# Patient Record
Sex: Male | Born: 1935 | Race: White | Hispanic: No | Marital: Married | Smoking: Former smoker
Health system: Southern US, Community
[De-identification: ages and names within clinical notes are randomized; demographics above are authoritative.]

## PROBLEM LIST (undated history)

## (undated) DIAGNOSIS — E785 Hyperlipidemia, unspecified: Secondary | ICD-10-CM

## (undated) DIAGNOSIS — Z9889 Other specified postprocedural states: Secondary | ICD-10-CM

## (undated) DIAGNOSIS — Z86711 Personal history of pulmonary embolism: Secondary | ICD-10-CM

## (undated) DIAGNOSIS — I4891 Unspecified atrial fibrillation: Secondary | ICD-10-CM

## (undated) DIAGNOSIS — K269 Duodenal ulcer, unspecified as acute or chronic, without hemorrhage or perforation: Secondary | ICD-10-CM

## (undated) DIAGNOSIS — I10 Essential (primary) hypertension: Secondary | ICD-10-CM

## (undated) DIAGNOSIS — K635 Polyp of colon: Secondary | ICD-10-CM

## (undated) HISTORY — DX: Polyp of colon: K63.5

## (undated) HISTORY — DX: Unspecified atrial fibrillation: I48.91

## (undated) HISTORY — DX: Hyperlipidemia, unspecified: E78.5

## (undated) HISTORY — DX: Duodenal ulcer, unspecified as acute or chronic, without hemorrhage or perforation: K26.9

## (undated) HISTORY — DX: Personal history of pulmonary embolism: Z86.711

## (undated) HISTORY — DX: Essential (primary) hypertension: I10

## (undated) HISTORY — DX: Other specified postprocedural states: Z98.890

---

## 1966-12-18 DIAGNOSIS — K269 Duodenal ulcer, unspecified as acute or chronic, without hemorrhage or perforation: Secondary | ICD-10-CM

## 1966-12-18 HISTORY — DX: Duodenal ulcer, unspecified as acute or chronic, without hemorrhage or perforation: K26.9

## 1983-12-19 DIAGNOSIS — K635 Polyp of colon: Secondary | ICD-10-CM

## 1983-12-19 HISTORY — DX: Polyp of colon: K63.5

## 1988-12-18 DIAGNOSIS — Z9889 Other specified postprocedural states: Secondary | ICD-10-CM

## 1988-12-18 HISTORY — PX: KNEE SURGERY: SHX244

## 1988-12-18 HISTORY — DX: Other specified postprocedural states: Z98.890

## 2000-03-22 ENCOUNTER — Inpatient Hospital Stay (HOSPITAL_COMMUNITY): Admission: EM | Admit: 2000-03-22 | Discharge: 2000-03-26 | Payer: Self-pay | Admitting: Emergency Medicine

## 2000-03-22 ENCOUNTER — Encounter: Payer: Self-pay | Admitting: Internal Medicine

## 2000-03-23 ENCOUNTER — Encounter: Payer: Self-pay | Admitting: Gastroenterology

## 2000-03-25 ENCOUNTER — Encounter: Payer: Self-pay | Admitting: Gastroenterology

## 2000-07-16 ENCOUNTER — Ambulatory Visit (HOSPITAL_COMMUNITY): Admission: RE | Admit: 2000-07-16 | Discharge: 2000-07-16 | Payer: Self-pay | Admitting: Gastroenterology

## 2003-04-01 ENCOUNTER — Encounter: Admission: RE | Admit: 2003-04-01 | Discharge: 2003-04-01 | Payer: Self-pay | Admitting: Otolaryngology

## 2003-04-01 ENCOUNTER — Encounter: Payer: Self-pay | Admitting: Otolaryngology

## 2005-08-08 ENCOUNTER — Encounter (INDEPENDENT_AMBULATORY_CARE_PROVIDER_SITE_OTHER): Payer: Self-pay | Admitting: *Deleted

## 2005-08-08 ENCOUNTER — Ambulatory Visit (HOSPITAL_COMMUNITY): Admission: RE | Admit: 2005-08-08 | Discharge: 2005-08-08 | Payer: Self-pay | Admitting: Gastroenterology

## 2005-12-18 DIAGNOSIS — Z86711 Personal history of pulmonary embolism: Secondary | ICD-10-CM

## 2005-12-18 HISTORY — DX: Personal history of pulmonary embolism: Z86.711

## 2006-06-01 ENCOUNTER — Inpatient Hospital Stay (HOSPITAL_COMMUNITY): Admission: EM | Admit: 2006-06-01 | Discharge: 2006-06-17 | Payer: Self-pay | Admitting: Internal Medicine

## 2006-06-06 ENCOUNTER — Ambulatory Visit: Payer: Self-pay | Admitting: Hematology and Oncology

## 2006-06-07 ENCOUNTER — Encounter: Payer: Self-pay | Admitting: Vascular Surgery

## 2006-09-04 ENCOUNTER — Encounter: Admission: RE | Admit: 2006-09-04 | Discharge: 2006-09-04 | Payer: Self-pay | Admitting: Cardiology

## 2006-12-18 DIAGNOSIS — I4891 Unspecified atrial fibrillation: Secondary | ICD-10-CM

## 2006-12-18 HISTORY — DX: Unspecified atrial fibrillation: I48.91

## 2007-02-12 ENCOUNTER — Encounter: Admission: RE | Admit: 2007-02-12 | Discharge: 2007-02-12 | Payer: Self-pay | Admitting: Gastroenterology

## 2007-03-27 ENCOUNTER — Ambulatory Visit (HOSPITAL_COMMUNITY): Admission: RE | Admit: 2007-03-27 | Discharge: 2007-03-27 | Payer: Self-pay | Admitting: Cardiology

## 2008-05-05 ENCOUNTER — Ambulatory Visit: Payer: Self-pay

## 2010-08-11 ENCOUNTER — Ambulatory Visit (HOSPITAL_COMMUNITY): Admission: RE | Admit: 2010-08-11 | Discharge: 2010-08-11 | Payer: Self-pay | Admitting: Gastroenterology

## 2010-08-11 LAB — HM COLONOSCOPY

## 2011-05-05 NOTE — Op Note (Signed)
NAME:  Nicholas Cruz, Nicholas Cruz               ACCOUNT NO.:  [id number]   MEDICAL RECORD NO.:  [id number]          PATIENT TYPE:  AMB   LOCATION:  ENDO                         FACILITY:  [facility]   PHYSICIAN:  Danise Edge, M.D.   DATE OF BIRTH:  Apr 26, 1931   DATE OF PROCEDURE:  08/08/2005  DATE OF DISCHARGE:                                 OPERATIVE REPORT   PROCEDURE:  Colonoscopy and polypectomy.   INDICATIONS:  Mr. [name] is a 77 year old male born [birth year].  Mr. [name] has undergone colonoscopic exams in the past to remove colon  polyps.  He is due for a surveillance colonoscopy with polypectomy to  prevent colon cancer.   PREMEDICATION:  Versed 10 mg and Demerol 75 mg.   DESCRIPTION OF PROCEDURE:  After obtaining informed consent, Mr. [name] was  placed in the left lateral decubitus position. I administered intravenous  Demerol and intravenous Versed to achieve conscious sedation for the  procedure. The patient's blood pressure, oxygen saturation and cardiac  rhythm were monitored throughout the procedure and documented in the medical  record.   Anal inspection was normal. Digital rectal exam reveals a nonnodular  prostate. The Olympus adjustable pediatric colonoscope was introduced into  the rectum and easily advanced to the cecum.  A normal-appearing appendiceal  orifice and ileocecal valve were identified.  Colonic preparation for the  exam today was excellent   Rectum normal.  Retroflex view of the distal rectum normal.   Sigmoid colon and descending colon:  Left colonic diverticulosis.   Splenic flexure:  A 2-mm sessile polyp was removed with the electrocautery  snare and submitted pathologic interpretation.   Transverse colon normal.   Hepatic flexure normal.   Ascending colon normal.   Cecum and ileocecal valve normal.   ASSESSMENT:  1.  From the splenic flexure, a small polyp was removed with the      electrocautery snare.  2.  Left colonic  diverticulosis.           ______________________________  Danise Edge, M.D.     MJ/MEDQ  D:  08/08/2005  T:  08/08/2005  Job:  [id number]

## 2011-05-05 NOTE — Discharge Summary (Signed)
[place]. [facility]  Patient:    Nicholas Cruz                        MRN: [id number] Adm. Date:  03/22/00 Disc. Date: 03/26/00 Attending:  Verlin Grills, M.D.                           Discharge Summary  DISCHARGE DIAGNOSIS:  Recent onset atrial flutter.  DISCHARGE MEDICATIONS:  Per cardiology recommendations.  HISTORY OF PRESENT ILLNESS AND HOSPITAL COURSE:  Nicholas Cruz. Nicholas Cruz is a 77 year old male admitted to the hospital by Dr. Robley Fries March 22, 2000, with chest pressure-type discomfort and recent onset atrial flutter.  Dr. Corliss Marcus was consulted to manage this cardiac arrhythmia.  According to the medical record, [name] was started on Lovenox and Coumadin.  He was also started on sotalol.  On March 26, 2000, his atrial flutter converted to sinus rhythm.  His adenosine Cardiolite myocardial perfusion study was normal.  His left ventricular ejection fraction was 60%.  [name] was discharged in stable medical condition on Coumadin, to be followed up by Dr. Corliss Marcus.  LABORATORY DATA:  Discharge white blood cell count 10,500, hemoglobin 13.4 g, hematocrit 37.8%.  Complete metabolic panel was normal.  Lipid profile revealed a total cholesterol 234, triglycerides 203, HDL cholesterol 36, LDL cholesterol 157.  Cardiac enzymes were negative for myocardial ischemia.  TSH level 1.8 (normal). DD:  05/17/00 TD:  05/21/00 Job: [id number]

## 2011-05-05 NOTE — Consult Note (Signed)
[place]. [facility]  Patient:    Nicholas Cruz, Nicholas Cruz                      MRN: [id number] Proc. Date: 03/22/00 Adm. Date:  [id number] Attending:  Dennison Bulla Ii CC:         Pearla Dubonnet, M.D.                          Consultation Report  REASON FOR CONSULTATION:  Recent onset of atrial flutter.  HISTORY OF PRESENT ILLNESS:  Nicholas Cruz is a 77 year old man who has a history f intermittently irregular heart beat, which is brief, who this morning awoke in  sweat.  He got up to go to the bathroom and was found to be very light-headed and weak.  This resolved after about a half an hour but he did feel rather poorly so he presented himself to the office where atrial flutter with 2:1 block and ventricular rate of 150 were diagnosed.  Importantly, Nicholas Cruz has a history of chest pains which have been atypical in nature.  This occurred yesterday and has been worse than it was previously, was a mid-to-lower substernal pressure and aching discomfort that was relieved with belching.  This has been going on for several months.  Yesterday, his episode was worse and with Mylanta, it improved.  He did not feel any discomfort this morning in his chest when awoke.  He has not had any today.  His cardiac risk factors are hypertension, family history of coronary artery disease, age, sex and mildly elevated cholesterol.  PAST MEDICAL HISTORY: 1. Peptic ulcer disease/dyspepsia. 2. Irritable bowel syndrome. 3. Colonic polyps. 4. Allergic rhinitis.  MEDICATIONS: 1. Claritin-D 24 one p.o. q.d. 2. Pepcid 20 mg p.o. b.i.d. 3. Atenolol 50 mg p.o. q.h.s. 4. Viagra 100 mg p.o., none recently.  DRUG ALLERGIES:  None known.  PAST SURGICAL HISTORY:  Knee repair.  SOCIAL HISTORY:  Tobacco use from age 69 to 31 and still rare usage.  ETOH is moderate.  He is retired from [employer], married, accompanied by his wife and two daughters  today.  FAMILY HISTORY:  Father died of a myocardial infarction, age 72.  REVIEW OF SYSTEMS:  He admits to some decreasing exertional tolerance lately but no frequent headaches or double vision, no difficulty with his hearing, no dysphagia. No cough, hemoptysis, wheezing, shortness of breath.  No abdominal pain, diarrhea, constipation, hematochezia or melena.  No orthopnea, PND or lower extremity edema. No syncopal episodes.  No hematuria or dysuria, difficulty starting or stopping the stream.  No major joint pains.  No muscle weakness.  No history or seizure or stroke.  PHYSICAL EXAMINATION:  VITAL SIGNS:  Blood pressure 130/80.  Heart rate currently is 72 and regular. Respiratory rate 16.  Temperature 97.1.  GENERAL:  This is a well-appearing 77 year old man in no distress.  HEENT:  Unremarkable.  The head is atraumatic and normocephalic.  Pupils are equal, round and reactive to light and accommodation.  Extraocular movements are intact. Oral mucosa is pink and moist.  The tongue is not coated.  NECK:  Supple without thyromegaly or masses.  The carotid upstrokes are normal.  There is no bruit.  There is no jugular venous distention.  CHEST:  Clear with normal vesicular breath sounds throughout.  The precordium is quiet.  Normal S1 and S2.  No murmur, click or rub noted.  ABDOMEN:  Soft, flat, nontender.  No hepatosplenomegaly or midline pulsatile masses.  GENITALIA:  Normal male phallus.  Descended testes.  No lesions.  RECTAL:  Not performed.  EXTREMITIES:  Full range of motion.  No edema.  Intact distal pulses.  NEUROLOGIC:  Cranial nerves II-XII are intact.  Motor and sensory grossly intact. Gait not tested.  SKIN:  Warm, dry and clear.  LABORATORY AND X-RAY FINDINGS:  His electrocardiogram currently is normal sinus  rhythm, heart rate of 73 beats per minute, no significant changes.  Previously n the office, atrial flutter, 2:1 block, rate of 150, no  clear ischemic changes.  ASSESSMENT: 1. Paroxysm of atrial flutter, etiology uncertain; could be ethanol abuse,    secondary to pseudoephendrine present in Claritin-D, coronary artery    disease-related, structural heart disease.     I am concerned about his episodes of chest discomfort, even though he responded    to Mylanta.  They are temporally related to the onset of his tachyarrhythmia.  2. Hypertension; history of adequate control.  3. Gastroesophageal reflux disease.  4. Remote history of peptic ulcer disease.  PLAN/RECOMMENDATIONS: 1. I agree with your management thus far including the use of proton pump    inhibitor, IV Cardizem, Lovenox and aspirin.  Agree with increase in atenolol. 2. Agree with rule out of myocardial infarction by serial CK-MB enzyme and    troponin level.  Repeat ECG. 3. Will obtain exercise Cardiolite if rule out is negative.  Thank you very much for allowing me to assist in the care of Nicholas Cruz; it has been a pleasure to do so. DD:  03/22/00 TD:  03/22/00 Job: [id number]

## 2011-05-05 NOTE — Consult Note (Signed)
NAME:  Nicholas Cruz, Nicholas Cruz               ACCOUNT NO.:  [id number]   MEDICAL RECORD NO.:  [id number]          PATIENT TYPE:  INP   LOCATION:  3739                         FACILITY:  [facility]   PHYSICIAN:  Lauretta I. Odogwu, M.D.DATE OF BIRTH:  August 21, 1931   DATE OF CONSULTATION:  06/06/2006  DATE OF DISCHARGE:                                   CONSULTATION   CONSULTING PHYSICIAN:  Francisca December, M.D., Cardiology   REASON FOR CONSULTATION:  Pulmonary embolism.   FINDINGS:  The patient is a 77 year old gentleman admitted for evaluation  and management of chest pain.  He presented on June 01, 2006 with sudden  onset chest pain with shortness of breath at rest.  The patient has a  history significant for  paroxysmal atrial fibrillation/flutter and takes  antiarrhythmics.  He has never been treated with Coumadin in the past.  Evaluation noted an elevated D-dimer.  This prompted a chest angiogram  obtained on June 01, 2006 that demonstrated multilobar acute pulmonary  emboli with clot burden highest in the right middle and right lower lobe  pulmonary artery distribution.  The patient himself denies a personal or  family history of blood clots.  Specifically denies history of anorexia,  weight loss, and abdominal pain.  History is also negative for GI or GU  blood loss.   PAST MEDICAL HISTORY:  1.  History of paroxysmal atrial fibrillation/atrial flutter.  2.  Hypertension.  3.  Hyperlipidemia.  4.  GERD.  5.  History of peptic ulcer disease.  6.  History of colon polyps.  7.  History of irritable bowel syndrome.  8.  Status post knee surgery.   ALLERGIES:  No known drug allergies.   MEDICATIONS AT HOME:  Aspirin, Zetia, hydrochlorothiazide, Avapro, Librax,  Protonix, Xanax, heparin, calcium, nitroglycerine.   SOCIAL HISTORY:  The patient is married.  He is a retired [employer] manager.  He was a  previous smoker, smoking two packs of cigarette daily which he discontinued  two years ago.  Denies  history of alcohol use.   FAMILY HISTORY:  Negative for hypercoagulable disorders.   PHYSICAL EXAMINATION:  The patient is a well-appearing man, currently no  distress.  VITAL SIGNS:  Pulse 53, blood pressure 93/52, temperature 97.3, respirations  20, sats 96%.  HEENT:  Head is normocephalic, atraumatic.  Sclera is anicteric.  Mouth  moist.  No ulcerations or lesions.  Neck supple without thyromegaly.  CHEST:  Demonstrates good air entry bilaterally.  Clear to both percussion  and auscultation.  CARDIOVASCULAR EXAM:  Reveals first and second heart sounds, no added sounds  or murmurs.  ABDOMEN:  Soft, nontender with no hepatomegaly and bowel sounds present.  EXTREMITIES:  Demonstrate no calf tenderness.  Pulses were present and  symmetrical.  CNS:  Demonstrates no focal deficits.   LABORATORY DATA:  CBC:  White cell count 8.7, hemoglobin 13, hematocrit 39,  platelets 115,000.  Sodium 141, potassium 3.9, chloride 107, CO2 27, BUN 23,  creatinine 1.3, glucose 86.   IMPRESSION AND PLAN:  Pulmonary embolism:  Etiology is likely cardiac as the  pateint has  does have a history of atrial fibrillation. However a  hypercoaguable state needs to be ruled out.  He will have the following  tests drawn:- lupus anticoagulants, cardiolipin IgG, IgA,IgM antibodies,  Factor V  Leiden PCR, and homosteine level.  Because he is already on  anticaogulation will hold off checking antithrombin III, protein C and S  levels as these can be affected by anticoagulation.  I would also recommend  a serum protein electrophoresis, bilateral lower extremity Doppler's and CT  scan of the abdomen and pelvis to rule out occult malignancy.  Thank you for this consult.  Will follow with you.      Lauretta I. Odogwu, M.D.  Electronically Signed     LIO/MEDQ  D:  06/06/2006  T:  06/06/2006  Job:  [id number]   cc:   Francisca December, M.D.  Fax: [phone]   Danise Edge, M.D.  Fax: [phone]

## 2011-05-05 NOTE — Discharge Summary (Signed)
NAME:  Nicholas, Cruz               ACCOUNT NO.:  [id number]   MEDICAL RECORD NO.:  [id number]          PATIENT TYPE:  INP   LOCATION:  3739                         FACILITY:  [facility]   PHYSICIAN:  Corky Crafts, MDDATE OF BIRTH:  [birth year]   DATE OF ADMISSION:  06/01/2006  DATE OF DISCHARGE:  06/17/2006                                 DISCHARGE SUMMARY   DISCHARGE DIAGNOSES:  1.  Chest pain, resolved.  2.  Pulmonary embolism.  3.  Longterm Coumadin therapy initiated.  4.  Paroxysmal atrial fibrillation.  5.  Dyslipidemia.  6.  Hypertension.  7.  Chronic insomnia.  8.  Gastroesophageal reflux disease.  9.  Seasonal allergies.  10. Irritable bowel syndrome.  11. Longterm medication use.  12. Family history coronary artery disease.   HOSPITAL COURSE:  [name] is a 77 year old male patient who was admitted  on June 01, 2006, with history of chest pain.  Cardiac enzymes were  negative.  However, D-dimer was elevated at 1.47.  Ultimately a CT of the  chest was apparently done and it was positive for pulmonary embolism.  The  patient was given IV heparin and the Coumadin load was initiated.  His INR  slowly increased over a 10-day period and eventually became therapeutic.  Therefore, he was discharged to home.   Dr. Dalene Carrow was consulted from the Hematology/Oncology Service for further  input.  She ordered other lab studies, which include factor V, which was  negative, lupus anticoagulant was not detected.  PTT-LA was 170.9. PTT-LA  confirmation was 0, PTLA 4 to 1 mix elevated at 124.4, DRVVT was 50.8,  homocysteine level 20.9.  Alpha 1 globulin 5.8, beta 2 globulin 7.1.   Other labs traced during the patient's hospitalization included a hemoglobin  of 13.4, hematocrit 40.3, white count 8.6, platelets 200, PT on discharge  23.2, INR 2.0.  Total cholesterol 125, triglycerides 129, LDL 67, ACL 32,  cardiac isoenzymes negative, TSH 1.446.   Ultimately the patient's INR became  therapeutic, again several days as  mentioned above, and he was ready for discharge to home.   He was discharged to home on June 17, 2006, on the following medications:  1.  Coumadin 18 mg daily.  2.  Zetia 10 mg a day.  3.  Pravachol 80 mg a day.  4.  Diovan/hydrochlorothiazide 160/25, 1 tablet daily.  5.  Allegra 1 tablet daily.  6.  Nasal spray p.r.n.  7.  Protonix 40 mg a day.  8.  Lorazepam q.h.s. p.r.n.  9.  Librax t.i.d.  10. Sotalol 160 mg b.i.d.  11. Baby aspirin 81 mg a day.  The patient is to follow up in the Coumadin      Clinic on June 18, 2006, at 3:15 PM.  He has an office visit with Dr.      Amil Amen on June 29, 2006, at 1:20 PM.  He is to call for any further      questions.  He is to remain on a low fat Coumadin diet.      Guy Franco, P.A.  Corky Crafts, MD  Electronically Signed    LB/MEDQ  D:  07/02/2006  T:  07/02/2006  Job:  [id number]   cc:   Francisca December, M.D.  Fax: [phone]   Danise Edge, M.D.  Fax: [phone]

## 2011-05-05 NOTE — H&P (Signed)
[place]. [facility]  Patient:    Nicholas, Cruz                     MRN: [id number] Adm. Date:  03/22/00 Attending:  Pearla Dubonnet, M.D. CC:         Francisca December, M.D.             Charolett Bumpers III, M.D.                         History and Physical  CHIEF COMPLAINT:  Sweating, chest pressure, and rapid heart rhythm.  HISTORY OF PRESENT ILLNESS:  Mr. [name] is a pleasant 77 year old male with a history of uncomplicated hypertension, family history of coronary artery disease, mild hypercholesterolemia, 34-year smoking history but stopped 20 years ago but may still use oral tobacco products, who presents with being awakened this a.m. with severe diaphoresis and moderate anterior chest pressure.  The chest pressure lasted 20 to 30 minutes and resolved.  He still feels somewhat weak.  He felt palpitations yesterday.  He has felt palpitations in the past.  His EKG currently shows a ventricular rate of 150, and he is in atrial flutter with 2:1 conduction.  He is now admitted for further observation and therapy.  MEDICATIONS: 1. Claritin D 24, 1 p.o. q.a.m. 2. Pepcid 20 mg b.i.d. 3. Atenolol 50 mg p.o. q.h.s. 4. Occasional Viagra use 100 mg p.r.n. 5. Librax therapy in the past for irritable bowel syndrome but none recently.  PAST MEDICAL HISTORY: 1. As above. 2. Peptic ulcer disease. 3. Intermittent dyspepsia. 4. Irritable bowel syndrome. 5. Colonic polyps.  The patient was due for repeat colonoscopy last month - March    2001. 6. Hay fever.  PAST SURGICAL HISTORY:  Knee surgery in the past.  FAMILY HISTORY:  Contributory for father dying of heart attack at age 41.  SOCIAL HISTORY:  Tobacco use from age 63 to age 78, smoking; he may still use oral tobacco.  Moderate alcohol use.  The patient is retired from [employer]. He is married.  His wife is pleasant, supportive, and present today.  Their home number is  [phone].  Her name is Nicole Cella.  REVIEW OF SYSTEMS:  Denies decreased exertional tolerance lately.  No throat or arm pain today.  No dysphagia.  He is lightheaded when he sits up.  PHYSICAL EXAMINATION:  GENERAL:  A middle-aged white male in no acute distress, lying supine, comfortable.  VITAL SIGNS:  Blood pressure 152/102, pulse 150 and regular, temperature 97.1, weight 179, respiratory rate 16 and easy.  HEENT:  Atraumatic, normocephalic.  NECK:  Without JVD.  Carotids 2+ bilaterally without bruits.  No thyromegaly.  CHEST:  Clear to auscultation.  CARDIAC:  Rapid rhythm with no murmur or gallop.  Regular.  ABDOMEN:  Soft, nontender with no tenderness to palpation or organomegaly.  GU:  Not examined.  RECTAL:  Not examined.  EXTREMITIES:  Without cyanosis, clubbing, or edema.  NEUROLOGIC:  Nonfocal.  LABORATORY DATA:  EKG: Atrial flutter with a ventricular rate of 150 with 2:1 conduction.   No ST segment elevation or depression.  Pending labs include a chest x-ray, CBC, CMET, CPK x 3, troponin I, PT, PTT, urinalysis, TSH, and a lipid profile which will be scheduled for tomorrow morning.  ASSESSMENT:  A 77 year old male with new onset atrial flutter with increased ventricular rate.  We will need to rule out myocardial infarction  as he has multiple risk factors for coronary artery disease.  Doubt pulmonary embolus currently.  PLAN: 1. Monitored bed. 2. Treat with nasal cannula O2, Lovenox therapy 1 mg/kg q.12h., resume    atenolol 50 mg p.o. b.i.d. -- the patient had Toprol XL 100 mg p.o. x 1 in the    office -- IV Cardizem with 5 mg bolus, and aspirin daily.  The patient did have    2 regular strength aspirin in our office today. 3. Cardiology consult with Dr. Corliss Marcus. 4. A 2-D echocardiogram, rule out structural heart disease. 5. Check pulse oximetry to rule out hypoxia. 6. Follow up on chest x-ray and laboratories. 7. ? cardioversion in the  morning. 8. Watch for ethanol withdrawal.  Will treat with Xanax 0.25 mg p.o. q.12h.    for now. DD:  03/22/00 TD:  03/22/00 Job: [id number]

## 2011-05-05 NOTE — H&P (Signed)
NAME:  CLANCE, BAQUERO               ACCOUNT NO.:  [id number]   MEDICAL RECORD NO.:  [id number]          PATIENT TYPE:  INP   LOCATION:  3703                         FACILITY:  [facility]   PHYSICIAN:  Armanda Magic, M.D.     DATE OF BIRTH:  January 21, 1931   DATE OF ADMISSION:  06/01/2006  DATE OF DISCHARGE:                                HISTORY & PHYSICAL   PRIMARY CARE PHYSICIAN:  Dr Reece Agar at [facility].   CARDIOLOGIST:  Dr. Corliss Marcus.   CHIEF COMPLAINT:  Chest tightness and shortness of breath.   HISTORY OF PRESENT ILLNESS:  Mr. Gassman is a 77 year old male with a history  of paroxysmal atrial fibrillation, currently on sotalol therapy.  He denies  a history of chronic systemic anticoagulation. The patient complained of  nonexertional substernal chest tightness with a sudden onset at 5:00 p.m.  yesterday, without radiation. The chest pain lasted for three hours.  Associated with the chest pain was mild shortness of breath, however the  patient denied chest pain, palpitations, nausea, vomiting, dizziness, or  syncope.   Mr. Musa used his home blood pressure monitor to check his heart rate  which was 105 beats per minute. He then took sotalol 120 mg one-half tablet  and Maalox which produced belching with relief of the chest tightness. He  rechecked his heart rate using his home blood pressure monitor and it was 60  beats per minute. He denies a history of angina or chest tightness. The  chest tightness was unassociated with a meal. This morning, upon awakening,  Mr. Biel went out to get the morning paper and upon walking back down his  driveway to his house he experienced a sudden onset of shortness of breath,  followed by chest tightness. He denies palpitations, chest pain, nausea,  vomiting, diaphoresis, dizziness, and syncope. He was brought to the [facility] emergency department via emergency medical service.  While in the  emergency vehicle  his heart rate was measured in the 150s. He was given  sublingual nitroglycerin plus aspirin and placed on oxygen at 2 liters per  minute for relief. He denies a recurrence of the chest tightness since the  episode this morning. Upon arrival to the emergency department  a 12-lead  EKG was obtained and revealed atrial flutter with a heart rate of 91 beats  per minute. A subsequent EKG revealed atrial fibrillation with a heart rate  of 94 beats per minute. These EKGs are not consistent with a prior EKG dated  March 25, 2000, which revealed sinus bradycardia at a rate 57 beats per  minute.  Point of care enzymes are negative times two with a peak troponin  of 0.05.  On telemetry, Ms. Felkins is in atrial fibrillation with a heart  rate ranging from 64 to 137 beats per minute. A 2-D echocardiogram on March 14, 2006, revealed normal systolic function with mild LV concentric  hypertrophy. EF 65% to 70%.   PAST MEDICAL HISTORY:  1.  Paroxysmal atrial fibrillation/atrial flutter associated with a near  syncopal episode. Controlled on sotalol therapy.  2.  Hypertension.  3.  Hyperlipidemia.  4.  GERD.  5.  History of PUD.  6.  Seasonal allergies.  7.  History of colon polyps.  8.  Irritable bowel syndrome.  9.  Insomnia.  10. Status post knee surgery.   ALLERGIES:  No known drug allergies.   MEDICATIONS:  1.  Pravachol 80 mg daily.  2.  Zetia 10 mg daily.  3.  Diovan/hydrochlorothiazide 160/25 mg daily.  4.  Sotalol 120 mg one-half tablet b.i.d.  5.  Protonix 40 mg daily.  6.  Allegra 180 mg daily.  7.  Flurazepam 30 mg at bedtime as needed.  8.  Nasarel nasal spray 0.025% one spray in each nostril as needed.  9.  Librax t.i.d.   FAMILY HISTORY:  1.  Father deceased, age 18, MI.  2.  Mother deceased, age 75, old age.  3.  Brother deceased, cancer.  4.  Sister living, CVA, irregular heart beat.  5.  Sister living, questionable diabetes mellitus.  6.  Two sisters living--both  healthy.   SOCIAL HISTORY:  Married for 27 years. Lives with wife. Mr. Franta does not  have any biological children, however he has multiple stepchildren,  grandchildren, and great-grandchildren. He is a retired Fish farm manager, Merchandiser, retail.  He admits to former tobacco use for 35 years in which he smoked  approximately two packs per day with smoking cessation occurring 15 years  ago.  He denies alcohol and illicit drug use. He also denies a current  exercise regimen.   REVIEW OF SYSTEMS:  All other systems are negative other than what is stated  in the HPI.   PHYSICAL EXAMINATION:  GENERAL: A 77 year old male, well-developed, well-  nourished, pleasant and cooperative, NAD.  VITAL SIGNS: Temperature 97.0, pulse 122 beats per minute with a range (64  to 137 beats per minute), blood pressure 133/90, O2 saturations 97% on room  air.  HEENT: Unremarkable.  NECK: Supple without JVD or carotid bruits bilaterally.  LUNGS: Breath sounds are clear to auscultation bilaterally without rales,  rhonchi, crackles, or wheezes.  HEART: Irregular rate and irregular rhythm. S1 and S2 normal. No murmurs,  rubs, clicks, or gallops auscultated.  ABDOMEN: Soft, nontender, and nondistended with active bowel sounds. No  masses, hepatomegaly, or bilateral bruits.  EXTREMITIES: No peripheral edema. DP and PT pulses 2+/2 bilaterally.  SKIN: Warm and dry without rashes or lesions.  NEUROLOGIC: Alert and oriented times three. No focal deficits.  PSYCH: Normal  mood and affect.   LABORATORY DATA:  Sodium 138, potassium 4.6, chloride 107, CO2 25.2, BUN 22,  creatinine 1.2, glucose 99. White blood cell count 9.7, hemoglobin 14.4,  hematocrit 42.9, platelet count 173,000.  Point of care enzymes reveal  myoglobin 192 and 143 respectively. CK-MB 2.2 and 1.5 respectively. Troponin-  I less than 0.05 and less than 0.05 respectively. PT 14.0, INR 1.1, PTT 32. Magnesium 2.1. BNP 149.0. D-dimer 1.47.  Serial cardiac enzymes  reveal CK  total 179, CK-MB 2.5, and troponin-I of 0.02.  Portable chest x-ray and TSH  are pending.   ASSESSMENT:  1.  Chest tightness, resolved.  2.  Dyspnea.  3.  Paroxysmal atrial fibrillation, now in atrial fibrillation.  4.  Hypertension, controlled.  5.  Hyperlipidemia.  6.  Gastroesophageal reflux disease.  7.  History of peptic ulcer disease.  8.  Seasonal allergies.  9.  Insomnia.  10. Irritable bowel syndrome.   PLAN:  1.  Admit to cardiac telemetry unit under the service of Dr. Armanda Magic      for diagnosis of chest pain and atrial fibrillation.  2.  Start IV Cardizem drip at 5 mg per hour for rate control.  3.  STAT 2-D echocardiogram.  4.  Subcutaneous Lovenox q.12h. per pharmacy protocol.  5.  Continue sotalol therapy at 120 mg one half tablet b.i.d.  6.  Initiate cardiology p.r.n. orders.  7.  BMET, CBC, EKG, and FLP in the a.m.  8.  IV nitroglycerin at 10 mcg per minute. Titrate for chest pain/chest      tightness at 5-10 mcg per minute keeping a systolic blood pressure of      greater than 100 beats per minute.  9.  Obtain chest CT secondary to elevated D-dimer.  10. The patient has been scheduled for a cardiac catheterization on Monday,      June 04, 2006, at 4:00 p.m. with Dr. Amil Amen.   The patient was seen, interviewed, and examined by Dr. Armanda Magic who  participated in the medical decision making and plan of care. The cardiac  catheterization procedure, risks, and complications including CVA, MI,  death, contrast dye allergy, renal insufficiency within the first 48 hours  post cath, bleeding, oozing, hematoma, or other vascular complications such  as pseudoaneurysm have been explained to the patient and his family. The  patient agrees to full understanding of the information and wishes to  proceed with the cardiac catheterization as scheduled.      Tylene Fantasia, [place]      Armanda Magic, M.D.  Electronically Signed    RDM/MEDQ  D:   06/01/2006  T:  06/01/2006  Job:  [id number]   cc:   Danise Edge, M.D.  Fax: [phone]

## 2011-05-05 NOTE — Op Note (Signed)
NAME:  Nicholas Cruz, Nicholas Cruz               ACCOUNT NO.:  [id number]   MEDICAL RECORD NO.:  [id number]          PATIENT TYPE:  OIB   LOCATION:  2899                         FACILITY:  [facility]   PHYSICIAN:  Francisca December, M.D.  DATE OF BIRTH:  [birth year]   DATE OF PROCEDURE:  03/27/2007  DATE OF DISCHARGE:                               OPERATIVE REPORT   PROCEDURE PERFORMED:  Elective direct current cardioversion.   INDICATIONS FOR PROCEDURE:  Koua Deeg is a 77 year old man with  recurrent atrial fibrillation.  He has now been treated for six weeks  with oral amiodarone.  He has received an adequate loading dose.  He is  systemically anticoagulated for four weeks using warfarin with an INR of  2.28 at this time.  He is to undergo attempted restoration of sinus  rhythm.   ANESTHESIA:  Oley Balm. Georgina Pillion, M.D., 425 mg of Pentothal (see below).   PROCEDURAL NOTE:  While monitoring heart rate, blood pressure, O2  saturation and ECG the patient received an initial dose of 175 mg of  Pentothal.  He then underwent cardioversion using direct biphasic  synchronized transthoracic dose of 200 joules.  There was transient  return of sinus rhythm and then recurrent atrial fibrillation.  He  received an additional dose of IV Pentothal and underwent a second  cardioversion in a similar fashion with a similar result.  Finally, he  received 10 mg of metoprolol intravenously and then underwent a third  attempt at conversion to sinus rhythm, again to 200 joules, biphasic and  synchronized.  Again, sinus rhythm maintained for about 30 seconds and  then atrial fibrillation returned.  Further attempts were discontinued.   IMPRESSION:  Unsuccessful attempted DCCV despite adequate loading dose  of amiodarone.   PLAN:  Options are to obtain an amiodarone level.  If still low,  continue amiodarone dosing until therapeutic range and then repeat  cardioversion versus rate control with Cardizem and/or beta blocker  and  lifelong chronic Coumadin.  We will discuss further with the patient.      Francisca December, M.D.  Electronically Signed     JHE/MEDQ  D:  03/27/2007  T:  03/27/2007  Job:  [id number]

## 2011-09-06 ENCOUNTER — Other Ambulatory Visit: Payer: Self-pay | Admitting: Dermatology

## 2013-02-26 ENCOUNTER — Ambulatory Visit: Payer: Self-pay | Admitting: Family Medicine

## 2013-04-30 ENCOUNTER — Other Ambulatory Visit: Payer: Self-pay | Admitting: Dermatology

## 2013-09-02 ENCOUNTER — Other Ambulatory Visit: Payer: Self-pay | Admitting: Interventional Cardiology

## 2013-09-02 DIAGNOSIS — I4891 Unspecified atrial fibrillation: Secondary | ICD-10-CM

## 2013-09-02 DIAGNOSIS — Z79899 Other long term (current) drug therapy: Secondary | ICD-10-CM

## 2013-09-06 ENCOUNTER — Other Ambulatory Visit: Payer: Self-pay | Admitting: Interventional Cardiology

## 2013-09-06 DIAGNOSIS — I4891 Unspecified atrial fibrillation: Secondary | ICD-10-CM

## 2013-09-06 DIAGNOSIS — Z79899 Other long term (current) drug therapy: Secondary | ICD-10-CM

## 2013-09-18 ENCOUNTER — Other Ambulatory Visit: Payer: Self-pay | Admitting: Dermatology

## 2013-09-29 ENCOUNTER — Other Ambulatory Visit: Payer: Self-pay | Admitting: Interventional Cardiology

## 2013-10-21 ENCOUNTER — Telehealth: Payer: Self-pay | Admitting: Interventional Cardiology

## 2013-10-21 NOTE — Telephone Encounter (Deleted)
error 

## 2013-10-22 NOTE — Telephone Encounter (Signed)
New Problem:  Pt wife states she needs to speak to Amy. Pt's wife states she will give more details when Amy calls.

## 2013-10-22 NOTE — Telephone Encounter (Addendum)
Spoke with pts wife and answered question regarding pts lab appt this month.

## 2013-11-03 ENCOUNTER — Other Ambulatory Visit (INDEPENDENT_AMBULATORY_CARE_PROVIDER_SITE_OTHER): Payer: Medicare Other

## 2013-11-03 DIAGNOSIS — I4891 Unspecified atrial fibrillation: Secondary | ICD-10-CM

## 2013-11-03 DIAGNOSIS — Z79899 Other long term (current) drug therapy: Secondary | ICD-10-CM

## 2013-11-03 LAB — CBC
HCT: 46.3 % (ref 39.0–52.0)
Hemoglobin: 15.5 g/dL (ref 13.0–17.0)
MCHC: 33.4 g/dL (ref 30.0–36.0)
MCV: 90.8 fl (ref 78.0–100.0)
Platelets: 186 10*3/uL (ref 150.0–400.0)
RBC: 5.1 Mil/uL (ref 4.22–5.81)
RDW: 14.3 % (ref 11.5–14.6)
WBC: 10.5 10*3/uL (ref 4.5–10.5)

## 2013-11-03 LAB — CREATININE, SERUM: Creatinine, Ser: 1.3 mg/dL (ref 0.4–1.5)

## 2013-11-04 ENCOUNTER — Other Ambulatory Visit: Payer: Self-pay | Admitting: *Deleted

## 2013-11-04 DIAGNOSIS — E782 Mixed hyperlipidemia: Secondary | ICD-10-CM

## 2013-11-18 ENCOUNTER — Encounter: Payer: Self-pay | Admitting: Cardiology

## 2013-11-18 ENCOUNTER — Telehealth: Payer: Self-pay | Admitting: Cardiology

## 2013-11-18 DIAGNOSIS — I4891 Unspecified atrial fibrillation: Secondary | ICD-10-CM

## 2013-11-18 NOTE — Telephone Encounter (Signed)
Message copied by Theda Sers on Tue Nov 18, 2013 11:41 AM ------      Message from: SMART, Gaspar Skeeters      Created: Mon Nov 17, 2013  8:08 AM       Weight 179 lbs, Scr 1.3, age 79 y.o.  Continue Eliquis 5 mg bid and recheck BMET and CBC in 6 months. ------

## 2013-11-18 NOTE — Telephone Encounter (Signed)
Future labs ordered.  

## 2013-11-24 ENCOUNTER — Other Ambulatory Visit: Payer: Self-pay

## 2014-02-11 ENCOUNTER — Other Ambulatory Visit: Payer: Self-pay

## 2014-02-13 ENCOUNTER — Other Ambulatory Visit: Payer: Self-pay | Admitting: Interventional Cardiology

## 2014-05-12 ENCOUNTER — Other Ambulatory Visit (INDEPENDENT_AMBULATORY_CARE_PROVIDER_SITE_OTHER): Payer: 59

## 2014-05-12 DIAGNOSIS — I4891 Unspecified atrial fibrillation: Secondary | ICD-10-CM

## 2014-05-12 LAB — CBC
HCT: 43.8 % (ref 39.0–52.0)
Hemoglobin: 14.8 g/dL (ref 13.0–17.0)
MCHC: 33.8 g/dL (ref 30.0–36.0)
MCV: 91.5 fl (ref 78.0–100.0)
Platelets: 182 10*3/uL (ref 150.0–400.0)
RBC: 4.79 Mil/uL (ref 4.22–5.81)
RDW: 14.3 % (ref 11.5–15.5)
WBC: 10.3 10*3/uL (ref 4.0–10.5)

## 2014-05-12 LAB — BASIC METABOLIC PANEL
BUN: 20 mg/dL (ref 6–23)
CO2: 28 mEq/L (ref 19–32)
Calcium: 9.2 mg/dL (ref 8.4–10.5)
Chloride: 101 mEq/L (ref 96–112)
Creatinine, Ser: 1.2 mg/dL (ref 0.4–1.5)
GFR: 62.36 mL/min (ref >60.00)
Glucose, Bld: 103 mg/dL — ABNORMAL HIGH (ref 70–99)
Potassium: 3.2 mEq/L — ABNORMAL LOW (ref 3.5–5.1)
Sodium: 138 mEq/L (ref 135–145)

## 2014-05-14 ENCOUNTER — Telehealth: Payer: Self-pay | Admitting: Cardiology

## 2014-05-14 DIAGNOSIS — E876 Hypokalemia: Secondary | ICD-10-CM

## 2014-05-14 MED ORDER — POTASSIUM CHLORIDE CRYS ER 20 MEQ PO TBCR: 20.0000 meq | EXTENDED_RELEASE_TABLET | Freq: Every day | ORAL | Status: DC

## 2014-05-14 NOTE — Telephone Encounter (Signed)
Pt notified, meds updated and labs ordered.  

## 2014-05-14 NOTE — Telephone Encounter (Signed)
Message copied by Alcario Drought on Thu May 14, 2014  7:59 AM ------      Message from: Jettie Booze      Created: Wed May 13, 2014 11:10 PM       Start KCl 48mEq Daily. Recheck BMet in one week. ------

## 2014-05-15 ENCOUNTER — Other Ambulatory Visit: Payer: Self-pay

## 2014-05-21 ENCOUNTER — Ambulatory Visit (INDEPENDENT_AMBULATORY_CARE_PROVIDER_SITE_OTHER): Payer: Medicare Other | Admitting: *Deleted

## 2014-05-21 DIAGNOSIS — Z79899 Other long term (current) drug therapy: Secondary | ICD-10-CM

## 2014-05-21 DIAGNOSIS — E876 Hypokalemia: Secondary | ICD-10-CM

## 2014-05-21 DIAGNOSIS — E782 Mixed hyperlipidemia: Secondary | ICD-10-CM

## 2014-05-21 DIAGNOSIS — I4891 Unspecified atrial fibrillation: Secondary | ICD-10-CM

## 2014-05-21 LAB — BASIC METABOLIC PANEL
BUN: 15 mg/dL (ref 6–23)
CO2: 30 mEq/L (ref 19–32)
Calcium: 9.5 mg/dL (ref 8.4–10.5)
Chloride: 99 mEq/L (ref 96–112)
Creatinine, Ser: 1.1 mg/dL (ref 0.4–1.5)
GFR: 66.2 mL/min (ref >60.00)
Glucose, Bld: 87 mg/dL (ref 70–99)
Potassium: 4 mEq/L (ref 3.5–5.1)
Sodium: 137 mEq/L (ref 135–145)

## 2014-05-21 LAB — ALT: ALT: 27 U/L (ref 0–53)

## 2014-05-21 LAB — LIPID PANEL
Cholesterol: 177 mg/dL (ref 0–200)
HDL: 45.4 mg/dL (ref >39.00)
LDL Cholesterol: 94 mg/dL (ref 0–99)
NonHDL: 131.6
Total CHOL/HDL Ratio: 4
Triglycerides: 190 mg/dL — ABNORMAL HIGH (ref 0.0–149.0)
VLDL: 38 mg/dL (ref 0.0–40.0)

## 2014-05-21 LAB — CREATININE, SERUM: Creatinine, Ser: 1.1 mg/dL (ref 0.4–1.5)

## 2014-05-28 ENCOUNTER — Other Ambulatory Visit: Payer: Self-pay | Admitting: Cardiology

## 2014-05-28 DIAGNOSIS — E782 Mixed hyperlipidemia: Secondary | ICD-10-CM

## 2014-06-08 ENCOUNTER — Other Ambulatory Visit: Payer: Self-pay | Admitting: Interventional Cardiology

## 2014-06-18 ENCOUNTER — Ambulatory Visit: Payer: Medicare Other | Admitting: Family Medicine

## 2014-06-22 ENCOUNTER — Encounter: Payer: Self-pay | Admitting: Family Medicine

## 2014-06-22 ENCOUNTER — Ambulatory Visit (INDEPENDENT_AMBULATORY_CARE_PROVIDER_SITE_OTHER): Payer: Medicare Other | Admitting: Family Medicine

## 2014-06-22 VITALS — BP 138/80 | HR 105 | Ht 67.0 in | Wt 184.0 lb

## 2014-06-22 DIAGNOSIS — J301 Allergic rhinitis due to pollen: Secondary | ICD-10-CM

## 2014-06-22 DIAGNOSIS — E785 Hyperlipidemia, unspecified: Secondary | ICD-10-CM

## 2014-06-22 DIAGNOSIS — J309 Allergic rhinitis, unspecified: Secondary | ICD-10-CM | POA: Insufficient documentation

## 2014-06-22 DIAGNOSIS — C4490 Unspecified malignant neoplasm of skin, unspecified: Secondary | ICD-10-CM

## 2014-06-22 DIAGNOSIS — I4891 Unspecified atrial fibrillation: Secondary | ICD-10-CM | POA: Insufficient documentation

## 2014-06-22 DIAGNOSIS — N400 Enlarged prostate without lower urinary tract symptoms: Secondary | ICD-10-CM

## 2014-06-22 DIAGNOSIS — K219 Gastro-esophageal reflux disease without esophagitis: Secondary | ICD-10-CM

## 2014-06-22 MED ORDER — CILIDINIUM-CHLORDIAZEPOXIDE 2.5-5 MG PO CAPS: 1.0000 | ORAL_CAPSULE | Freq: Three times a day (TID) | ORAL | Status: DC

## 2014-06-22 NOTE — Progress Notes (Signed)
Subjective:    Patient ID: Nicholas Cruz, male    DOB: [birth year], 80 y.o.   MRN: [id number]  HPI BPH - Has been on flomax for years.  Only getting up a night about once.  Does cut out his fluids by 8:30.  He saw never tested for a supplement on TV which is mostly saw palmetto and wants a medication start taking it or not.  Atrial fibrillation-he is completely asymptomatic with his A. fib. He did have a failed ablation. He is currently on Elavil with. He was on Xarelto previously but was worried about the bleeding risk based on the partial on TV. He reports that his blood work is up today and in fact says he had blood work twice this year. He has a followup with his cardiologist in September.  GERD-he does take his Protonix daily.  Review of Systems  Constitutional: Negative for fever, diaphoresis and unexpected weight change.  HENT: Negative for hearing loss, rhinorrhea, sneezing and tinnitus.        + hayfever  Eyes: Negative for visual disturbance.  Respiratory: Negative for cough and wheezing.   Cardiovascular: Negative for chest pain and palpitations.  Gastrointestinal: Negative for nausea, vomiting, diarrhea and blood in stool.  Genitourinary: Negative for dysuria and discharge.  Musculoskeletal: Negative for arthralgias and myalgias.  Skin: Negative for rash.  Neurological: Negative for headaches.  Hematological: Negative for adenopathy. Bruises/bleeds easily.  Psychiatric/Behavioral: Negative for sleep disturbance and dysphoric mood. The patient is not nervous/anxious.        + memory loss   BP 138/80  Pulse 105  Ht 5\' 7"  (1.702 m)  Wt 184 lb (83.462 kg)  BMI 28.81 kg/m2    No Known Allergies  Past Medical History  Diagnosis Date  . Duodenal ulcer 1968  . Colorectal polyps 1985  . H/O knee surgery 1990  . Atrial fibrillation 2008  . History of pulmonary embolism 2007    Past Surgical History  Procedure Laterality Date  . Knee surgery  1990    HS    History    Social History  . Marital Status: Married    Spouse Name: Earlie Server    Number of Children: 5  . Years of Education: N/A   Occupational History  . retired    Social History Main Topics  . Smoking status: Never Smoker   . Smokeless tobacco: Not on file  . Alcohol Use: No  . Drug Use: No  . Sexual Activity: Not Currently    Partners: Female   Other Topics Concern  . Not on file   Social History Narrative   1 caffeine drink per day. He is fairly active but no regular exercise. He's retired and has a high school degree. His wife's name is Earlie Server    Family History  Problem Relation Age of Onset  . Cancer - Lung Brother   . Heart attack Father     Outpatient Encounter Prescriptions as of 06/22/2014  Medication Sig  . clindamycin (CLEOCIN T) 1 % lotion Apply 1 application topically 2 (two) times daily.  Marland Kitchen DIGOX 125 MCG tablet TAKE 1 TABLET DAILY  . diltiazem (DILACOR XR) 240 MG 24 hr capsule   . ELIQUIS 5 MG TABS tablet TAKE 1 TABLET TWICE A DAY  . mometasone (NASONEX) 50 MCG/ACT nasal spray Place 2 sprays into the nose daily.  . Omega-3 Fatty Acids (FISH OIL) 1000 MG CAPS Take by mouth.  . pantoprazole (PROTONIX) 40 MG tablet   .  potassium chloride SA (KLOR-CON M20) 20 MEQ tablet Take 1 tablet (20 mEq total) by mouth daily.  . pravastatin (PRAVACHOL) 80 MG tablet   . tamsulosin (FLOMAX) 0.4 MG CAPS capsule   . TEKTURNA HCT 150-25 MG TABS TAKE 1 TABLET AT BEDTIME.  . clidinium-chlordiazePOXIDE (LIBRAX) 5-2.5 MG per capsule Take 1 capsule by mouth 3 (three) times daily before meals.          Objective:   Physical Exam  Constitutional: He is oriented to person, place, and time. He appears well-developed and well-nourished.  HENT:  Head: Normocephalic and atraumatic.  Cardiovascular: Normal rate, regular rhythm and normal heart sounds.   No carotid bruit   Pulmonary/Chest: Effort normal and breath sounds normal.  Musculoskeletal: He exhibits edema.  Trace edema bilat  with more on the right than left.   Neurological: He is alert and oriented to person, place, and time.  Skin: Skin is warm and dry.  Psychiatric: He has a normal mood and affect. His behavior is normal.          Assessment & Plan:  BPH - overall symptoms are well-controlled on current regimen. He could certainly consider saw palmetto did explain to him that the studies are overall fairly neutral but whether not there is an actual benefit or not. He says he will try if it doesn't work better than the Flomax he is Re: taking. Encouraged him to stick with the Flomax.  Atrial fibrillation-under good control. His pulse is a little high at 1 and 5 today but he is completely asymptomatic. He is currently on digoxin as well as diltiazem and Eliquis. Next  GERD-he takes his Protonix daily. Consider monitoring for osteoporosis as assessment and at higher risk for fractures.

## 2014-07-01 ENCOUNTER — Encounter: Payer: Self-pay | Admitting: Family Medicine

## 2014-07-01 ENCOUNTER — Ambulatory Visit (INDEPENDENT_AMBULATORY_CARE_PROVIDER_SITE_OTHER): Payer: Medicare Other | Admitting: Family Medicine

## 2014-07-01 VITALS — BP 153/72 | HR 92 | Ht 67.0 in | Wt 182.0 lb

## 2014-07-01 DIAGNOSIS — R21 Rash and other nonspecific skin eruption: Secondary | ICD-10-CM

## 2014-07-01 DIAGNOSIS — Z23 Encounter for immunization: Secondary | ICD-10-CM

## 2014-07-01 DIAGNOSIS — Z0000 Encounter for general adult medical examination without abnormal findings: Secondary | ICD-10-CM

## 2014-07-01 DIAGNOSIS — R3129 Other microscopic hematuria: Secondary | ICD-10-CM | POA: Insufficient documentation

## 2014-07-01 DIAGNOSIS — N182 Chronic kidney disease, stage 2 (mild): Secondary | ICD-10-CM | POA: Insufficient documentation

## 2014-07-01 NOTE — Progress Notes (Signed)
Subjective:    Nicholas Cruz is a 80 y.o. male who presents for Medicare Annual/Subsequent preventive examination.   Preventive Screening-Counseling & Management  Tobacco History  Smoking status  . Never Smoker   Smokeless tobacco  . Not on file    Problems Prior to Visit 1. small rash on right lower leg. He says it's been there for about 2 weeks. Has not tried any treatments on it. It is not bothersome or itchy. He is never had a rash before.  Current Problems (verified) Patient Active Problem List   Diagnosis Date Noted  . Atrial fibrillation 06/22/2014  . BPH (benign prostatic hyperplasia) 06/22/2014  . Other and unspecified hyperlipidemia 06/22/2014  . Skin cancer 06/22/2014  . AR (allergic rhinitis) 06/22/2014  . GERD (gastroesophageal reflux disease) 06/22/2014  . Esophageal reflux 06/22/2014    Medications Prior to Visit Current Outpatient Prescriptions on File Prior to Visit  Medication Sig Dispense Refill  . clidinium-chlordiazePOXIDE (LIBRAX) 5-2.5 MG per capsule Take 1 capsule by mouth 3 (three) times daily before meals.  1 capsule  0  . clindamycin (CLEOCIN T) 1 % lotion Apply 1 application topically 2 (two) times daily.      Marland Kitchen DIGOX 125 MCG tablet TAKE 1 TABLET DAILY  31 tablet  9  . diltiazem (DILACOR XR) 240 MG 24 hr capsule       . ELIQUIS 5 MG TABS tablet TAKE 1 TABLET TWICE A DAY  60 tablet  4  . mometasone (NASONEX) 50 MCG/ACT nasal spray Place 2 sprays into the nose daily.      . Omega-3 Fatty Acids (FISH OIL) 1000 MG CAPS Take by mouth.      . pantoprazole (PROTONIX) 40 MG tablet       . potassium chloride SA (KLOR-CON M20) 20 MEQ tablet Take 1 tablet (20 mEq total) by mouth daily.  30 tablet  6  . pravastatin (PRAVACHOL) 80 MG tablet       . tamsulosin (FLOMAX) 0.4 MG CAPS capsule       . TEKTURNA HCT 150-25 MG TABS TAKE 1 TABLET AT BEDTIME.  30 each  3   No current facility-administered medications on file prior to visit.    Current  Medications (verified) Current Outpatient Prescriptions  Medication Sig Dispense Refill  . clidinium-chlordiazePOXIDE (LIBRAX) 5-2.5 MG per capsule Take 1 capsule by mouth 3 (three) times daily before meals.  1 capsule  0  . clindamycin (CLEOCIN T) 1 % lotion Apply 1 application topically 2 (two) times daily.      Marland Kitchen DIGOX 125 MCG tablet TAKE 1 TABLET DAILY  31 tablet  9  . diltiazem (DILACOR XR) 240 MG 24 hr capsule       . ELIQUIS 5 MG TABS tablet TAKE 1 TABLET TWICE A DAY  60 tablet  4  . mometasone (NASONEX) 50 MCG/ACT nasal spray Place 2 sprays into the nose daily.      . Omega-3 Fatty Acids (FISH OIL) 1000 MG CAPS Take by mouth.      . pantoprazole (PROTONIX) 40 MG tablet       . potassium chloride SA (KLOR-CON M20) 20 MEQ tablet Take 1 tablet (20 mEq total) by mouth daily.  30 tablet  6  . pravastatin (PRAVACHOL) 80 MG tablet       . tamsulosin (FLOMAX) 0.4 MG CAPS capsule       . TEKTURNA HCT 150-25 MG TABS TAKE 1 TABLET AT BEDTIME.  30 each  3  No current facility-administered medications for this visit.     Allergies (verified) Review of patient's allergies indicates no known allergies.   PAST HISTORY  Family History Family History  Problem Relation Age of Onset  . Cancer - Lung Brother   . Heart attack Father     Social History History  Substance Use Topics  . Smoking status: Never Smoker   . Smokeless tobacco: Not on file  . Alcohol Use: No    Are there smokers in your home (other than you)?  No  Risk Factors Current exercise habits: walking for exercise  Dietary issues discussed: none   Cardiac risk factors: advanced age (older than 39 for men, 59 for women) and sedentary lifestyle.  Depression Screen (Note: if answer to either of the following is "Yes", a more complete depression screening is indicated)   Q1: Over the past two weeks, have you felt down, depressed or hopeless? No  Q2: Over the past two weeks, have you felt little interest or pleasure in  doing things? No  Have you lost interest or pleasure in daily life? No  Do you often feel hopeless? No  Do you cry easily over simple problems? No  Activities of Daily Living In your present state of health, do you have any difficulty performing the following activities?:  Driving? No Managing money?  No Feeding yourself? No Getting from bed to chair? No Climbing a flight of stairs? No Preparing food and eating?: No Bathing or showering? No Getting dressed: No Getting to the toilet? No Using the toilet:No Moving around from place to place: No In the past year have you fallen or had a near fall?:No   Are you sexually active?  No  Do you have more than one partner?     Hearing Difficulties: No Do you often ask people to speak up or repeat themselves? No Do you experience ringing or noises in your ears? Yes Do you have difficulty understanding soft or whispered voices? No   Do you feel that you have a problem with memory? Yes  Do you often misplace items? No  Do you feel safe at home?  Yes  Cognitive Testing  Alert? Yes  Normal Appearance?Yes  Oriented to person? Yes  Place? Yes   Time? Yes  Recall of three objects?  Yes  Can perform simple calculations? Yes  Displays appropriate judgment?Yes     Advanced Directives have been discussed with the patient? Yes   List the Names of Other Physician/Practitioners you currently use: 1.    Indicate any recent Medical Services you may have received from other than [facility] providers in the past year (date may be approximate).  Immunization History  Administered Date(s) Administered  . Influenza Whole 09/17/2013  . Pneumococcal-Unspecified 09/02/2012  . Tdap 12/02/2010  . Zoster 11/05/2007    Screening Tests Health Maintenance  Topic Date Due  . Colonoscopy  05/21/1983  . Influenza Vaccine  07/18/2014  . Tetanus/tdap  12/02/2020  . Pneumococcal Polysaccharide Vaccine Age 59 And Over  Completed  . Zostavax  Completed     All answers were reviewed with the patient and necessary referrals were made:  METHENEY,CATHERINE, MD   07/01/2014   History reviewed: allergies, current medications, past family history, past medical history, past social history, past surgical history and problem list  Review of Systems A comprehensive review of systems was negative.    Objective:     Vision by Snellen chart:see vision notes Blood pressure 153/72, pulse 92,  height 5\' 7"  (1.702 m), weight 182 lb (82.555 kg). Body mass index is 28.5 kg/(m^2).  BP 153/72  Pulse 92  Ht 5\' 7"  (1.702 m)  Wt 182 lb (82.555 kg)  BMI 28.50 kg/m2  General Appearance:    Alert, cooperative, no distress, appears stated age  Head:    Normocephalic, without obvious abnormality, atraumatic  Eyes:    PERRL, conjunctiva/corneas clear, EOM's intact, both eyes       Ears:    Normal TM's and external ear canals, both ears  Nose:   Nares normal, septum midline, mucosa normal, no drainage    or sinus tenderness  Throat:   Lips, mucosa, and tongue normal; teeth and gums normal  Neck:   Supple, symmetrical, trachea midline, no adenopathy;       thyroid:  No enlargement/tenderness/nodules; no carotid   bruit or JVD  Back:     Symmetric, no curvature, ROM normal, no CVA tenderness  Lungs:     Clear to auscultation bilaterally, respirations unlabored  Chest wall:    No tenderness or deformity  Heart:    Regular rate and rhythm, S1 and S2 normal, no murmur, rub   or gallop  Abdomen:     Soft, non-tender, bowel sounds active all four quadrants,    no masses, no organomegaly  Genitalia:    Normal male without lesion, discharge or tenderness  Rectal:    Normal tone, 2+ prostate, no masses or tenderness;   guaiac negative stool  Extremities:   Extremities normal, atraumatic, no cyanosis or edema  Pulses:   2+ and symmetric all extremities  Skin:   Skin color, texture, turgor normal.  + 1.5 cm round scaling erythematous patch on right loewr leg  anterior shin.   Lymph nodes:   Cervical, supraclavicular, and axillary nodes normal  Neurologic:   CNII-XII intact. Normal strength, sensation and reflexes      throughout       Assessment:     Medicare Wellness Exam       Plan:     During the course of the visit the patient was educated and counseled about appropriate screening and preventive services including:    Pneumococcal vaccine   - Prevnar 13 discussed today.   Rash - KOH scraping collected and sent today    Diet review for nutrition referral? Yes ____  Not Indicated _X__   Patient Instructions (the written plan) was given to the patient.  Medicare Attestation I have personally reviewed: The patient's medical and social history Their use of alcohol, tobacco or illicit drugs Their current medications and supplements The patient's functional ability including ADLs,fall risks, home safety risks, cognitive, and hearing and visual impairment Diet and physical activities Evidence for depression or mood disorders  The patient's weight, height, BMI, and visual acuity have been recorded in the chart.  I have made referrals, counseling, and provided education to the patient based on review of the above and I have provided the patient with a written personalized care plan for preventive services.     METHENEY,CATHERINE, MD   07/01/2014

## 2014-07-02 LAB — KOH PREP: RESULT - KOH: NONE SEEN

## 2014-07-03 ENCOUNTER — Other Ambulatory Visit: Payer: Self-pay | Admitting: Family Medicine

## 2014-07-03 MED ORDER — TRIAMCINOLONE ACETONIDE 0.5 % EX OINT: 1.0000 "application " | TOPICAL_OINTMENT | Freq: Every day | CUTANEOUS | Status: DC

## 2014-07-06 ENCOUNTER — Encounter: Payer: Self-pay | Admitting: Interventional Cardiology

## 2014-07-06 ENCOUNTER — Other Ambulatory Visit: Payer: Self-pay | Admitting: Interventional Cardiology

## 2014-08-03 ENCOUNTER — Other Ambulatory Visit: Payer: Self-pay | Admitting: Interventional Cardiology

## 2014-08-26 ENCOUNTER — Ambulatory Visit: Payer: Self-pay | Admitting: Interventional Cardiology

## 2014-09-08 ENCOUNTER — Ambulatory Visit (INDEPENDENT_AMBULATORY_CARE_PROVIDER_SITE_OTHER): Payer: Medicare Other | Admitting: Interventional Cardiology

## 2014-09-08 ENCOUNTER — Encounter: Payer: Self-pay | Admitting: Interventional Cardiology

## 2014-09-08 VITALS — BP 130/80 | HR 74 | Ht 66.5 in | Wt 183.9 lb

## 2014-09-08 DIAGNOSIS — I10 Essential (primary) hypertension: Secondary | ICD-10-CM

## 2014-09-08 DIAGNOSIS — E785 Hyperlipidemia, unspecified: Secondary | ICD-10-CM

## 2014-09-08 DIAGNOSIS — R29898 Other symptoms and signs involving the musculoskeletal system: Secondary | ICD-10-CM

## 2014-09-08 DIAGNOSIS — IMO0002 Reserved for concepts with insufficient information to code with codable children: Secondary | ICD-10-CM

## 2014-09-08 DIAGNOSIS — I4819 Other persistent atrial fibrillation: Secondary | ICD-10-CM

## 2014-09-08 DIAGNOSIS — I4891 Unspecified atrial fibrillation: Secondary | ICD-10-CM

## 2014-09-08 NOTE — Patient Instructions (Signed)
Your physician recommends that you continue on your current medications as directed. Please refer to the Current Medication list given to you today.  Your physician wants you to follow-up in: 1 year with Dr. Varanasi. You will receive a reminder letter in the mail two months in advance. If you don't receive a letter, please call our office to schedule the follow-up appointment.  

## 2014-09-08 NOTE — Progress Notes (Signed)
Patient ID: Nicholas Cruz, male   DOB: 10/29/1931, 80 y.o.   MRN: [id number]    [place], [address], [place]  [address] Phone: [phone] Fax:  [phone]  Date:  09/08/2014   ID:  Nicholas Cruz, DOB 09/24/1931, MRN [id number]  PCP:  Beatrice Lecher, MD      History of Present Illness: Nicholas Cruz is a 80 y.o. male who has had atrial fibrillation. He was on an antiarrhythmic in the past. He has had some hematuria in the recent past. He has had dark urine if he works in the yard in the past. It now occurs only with very stressful work. It clears up. Atrial Fibrillation F/U:  c/o Leg edema more at the end of the day.  Denies : Chest pain.  Dizziness.  Orthopnea.  Palpitations.  Syncope.     Wt Readings from Last 3 Encounters:  09/08/14 183 lb 14.4 oz (83.416 kg)  07/01/14 182 lb (82.555 kg)  06/22/14 184 lb (83.462 kg)     Past Medical History  Diagnosis Date  . Duodenal ulcer 1968  . Colorectal polyps 1985  . H/O knee surgery 1990  . Atrial fibrillation 2008  . History of pulmonary embolism 2007    Current Outpatient Prescriptions  Medication Sig Dispense Refill  . clidinium-chlordiazePOXIDE (LIBRAX) 5-2.5 MG per capsule Take 1 capsule by mouth 3 (three) times daily before meals.  1 capsule  0  . clindamycin (CLEOCIN T) 1 % lotion Apply 1 application topically 2 (two) times daily.      Marland Kitchen DIGOX 125 MCG tablet TAKE 1 TABLET DAILY  31 tablet  3  . diltiazem (DILACOR XR) 240 MG 24 hr capsule TAKE (1) CAPSULE DAILY.  30 capsule  1  . ELIQUIS 5 MG TABS tablet TAKE ONE TABLET TWICE DAILY  60 tablet  3  . mometasone (NASONEX) 50 MCG/ACT nasal spray Place 2 sprays into the nose daily.      . Omega-3 Fatty Acids (FISH OIL) 1000 MG CAPS Take by mouth.      . pantoprazole (PROTONIX) 40 MG tablet       . potassium chloride SA (KLOR-CON M20) 20 MEQ tablet Take 1 tablet (20 mEq total) by mouth daily.  30 tablet  6  . pravastatin (PRAVACHOL) 80 MG tablet        . tamsulosin (FLOMAX) 0.4 MG CAPS capsule       . TEKTURNA HCT 150-25 MG TABS TAKE 1 TABLET AT BEDTIME.  30 each  3  . triamcinolone ointment (KENALOG) 0.5 % Apply 1 application topically daily.  15 g  0   No current facility-administered medications for this visit.    Allergies:    Allergies  Allergen Reactions  . Pradaxa [Dabigatran Etexilate Mesylate] Other (See Comments)    Hemoptysis    Social History:  The patient  reports that he has never smoked. He does not have any smokeless tobacco history on file. He reports that he does not drink alcohol or use illicit drugs.   Family History:  The patient's family history includes Cancer - Lung in his brother; Heart attack in his father.   ROS:  Please see the history of present illness.  No nausea, vomiting.  No fevers, chills.  No focal weakness.  No dysuria. Left leg smaller- remembers having a test (sounded like ABI) showing less blood flow in the left leg.   All other systems reviewed and negative.   PHYSICAL EXAM:  VS:  BP 130/80  Pulse 74  Ht 5' 6.5" (1.689 m)  Wt 183 lb 14.4 oz (83.416 kg)  BMI 29.24 kg/m2 Well nourished, well developed, in no acute distress HEENT: normal Neck: no JVD, no carotid bruits Cardiac:  normal S1, S2; irregularly irregular Lungs:  clear to auscultation bilaterally, no wheezing, rhonchi or rales Abd: soft, nontender, no hepatomegaly Ext: no edema; left leg smaller-chronic  Skin: warm and dry Neuro:   no focal abnormalities noted  EKG:   AFib, rate controlled. NSST  ASSESSMENT AND PLAN:  Atrial fibrillation  IMAGING: EKG    Overton,Shana 08/27/2013 09:47:19 AM > Kaelem Brach,JAY 08/27/2013 10:04:46 AM > AFib, rate controlled. No change from prior   Notes: No sx. Last year, switched from Xarelto to Eliquis.    2. Hypertension  Continue Tekturna HCT Tablet, 150-25 MG, TAKE 1 TABLET AT BEDTIME. Continue Diltiazem HCl Capsule Extended Release 24 Hour, 240 MG, 1 capsule every day, Orally, Once a  day Controlled   3. Elevated cholesterol with high triglycerides  Continue Fish Oil Capsule, 1000 MG, 1 capsules, Orally, three times a day Notes: LDL controlled 99 in 12/14. He has restarted pravastatin.     Leg asymmetry: May be less blood flow in the left leg compared to the right.  We discussed Doppler but he hsa no claudication or any other sx.  WOuld not pursue revasc given his lack of sx, thereore, will not plan to do Doppler study.  If he develops sx, would plan LE arterial Doppler. Preventive Medicine  Adult topics discussed:  Diet: healthy diet.  Exercise: 5 days a week, at least 30 minutes of aerobic exercise.      Signed, Mina Marble, MD, [facility] 09/08/2014 3:16 PM

## 2014-09-28 ENCOUNTER — Other Ambulatory Visit: Payer: Self-pay | Admitting: Interventional Cardiology

## 2014-10-26 ENCOUNTER — Other Ambulatory Visit: Payer: Self-pay | Admitting: Interventional Cardiology

## 2014-10-26 ENCOUNTER — Other Ambulatory Visit: Payer: Self-pay | Admitting: Family Medicine

## 2014-10-30 ENCOUNTER — Other Ambulatory Visit: Payer: Self-pay | Admitting: Interventional Cardiology

## 2014-11-23 ENCOUNTER — Other Ambulatory Visit: Payer: Self-pay | Admitting: Family Medicine

## 2014-12-16 ENCOUNTER — Other Ambulatory Visit: Payer: Self-pay

## 2014-12-16 MED ORDER — APIXABAN 5 MG PO TABS: 5.0000 mg | ORAL_TABLET | Freq: Two times a day (BID) | ORAL | Status: DC

## 2014-12-16 MED ORDER — DILTIAZEM HCL ER COATED BEADS 240 MG PO CP24: ORAL_CAPSULE | ORAL | Status: DC

## 2014-12-16 MED ORDER — DIGOXIN 125 MCG PO TABS: 125.0000 ug | ORAL_TABLET | Freq: Every day | ORAL | Status: DC

## 2014-12-16 MED ORDER — ALISKIREN-HYDROCHLOROTHIAZIDE 150-25 MG PO TABS: 1.0000 | ORAL_TABLET | Freq: Every day | ORAL | Status: DC

## 2014-12-16 MED ORDER — PANTOPRAZOLE SODIUM 40 MG PO TBEC: 40.0000 mg | DELAYED_RELEASE_TABLET | Freq: Every day | ORAL | Status: DC

## 2014-12-16 MED ORDER — POTASSIUM CHLORIDE CRYS ER 20 MEQ PO TBCR: EXTENDED_RELEASE_TABLET | ORAL | Status: DC

## 2014-12-17 ENCOUNTER — Other Ambulatory Visit: Payer: Self-pay | Admitting: *Deleted

## 2014-12-17 MED ORDER — TAMSULOSIN HCL 0.4 MG PO CAPS: 0.4000 mg | ORAL_CAPSULE | Freq: Every day | ORAL | Status: DC

## 2014-12-17 MED ORDER — PANTOPRAZOLE SODIUM 40 MG PO TBEC: 40.0000 mg | DELAYED_RELEASE_TABLET | Freq: Every day | ORAL | Status: DC

## 2014-12-17 MED ORDER — CILIDINIUM-CHLORDIAZEPOXIDE 2.5-5 MG PO CAPS: 1.0000 | ORAL_CAPSULE | Freq: Three times a day (TID) | ORAL | Status: DC

## 2014-12-17 MED ORDER — PRAVASTATIN SODIUM 80 MG PO TABS: 80.0000 mg | ORAL_TABLET | Freq: Every day | ORAL | Status: DC

## 2015-01-01 ENCOUNTER — Encounter: Payer: Self-pay | Admitting: Family Medicine

## 2015-01-01 ENCOUNTER — Ambulatory Visit: Payer: Medicare Other | Admitting: Family Medicine

## 2015-01-01 ENCOUNTER — Ambulatory Visit (INDEPENDENT_AMBULATORY_CARE_PROVIDER_SITE_OTHER): Payer: Medicare Other | Admitting: Family Medicine

## 2015-01-01 ENCOUNTER — Telehealth: Payer: Self-pay | Admitting: *Deleted

## 2015-01-01 VITALS — BP 152/90 | HR 110

## 2015-01-01 DIAGNOSIS — I482 Chronic atrial fibrillation, unspecified: Secondary | ICD-10-CM

## 2015-01-01 DIAGNOSIS — N400 Enlarged prostate without lower urinary tract symptoms: Secondary | ICD-10-CM

## 2015-01-01 DIAGNOSIS — K219 Gastro-esophageal reflux disease without esophagitis: Secondary | ICD-10-CM

## 2015-01-01 DIAGNOSIS — N182 Chronic kidney disease, stage 2 (mild): Secondary | ICD-10-CM | POA: Diagnosis not present

## 2015-01-01 DIAGNOSIS — I10 Essential (primary) hypertension: Secondary | ICD-10-CM | POA: Diagnosis not present

## 2015-01-01 NOTE — Telephone Encounter (Signed)
Lab orders faxed to Dr. Hassell Done ofc.Nicholas Cruz, Nicholas Cruz

## 2015-01-01 NOTE — Progress Notes (Signed)
   Subjective:    Patient ID: Nicholas Cruz, male    DOB: [birth year], 81 y.o.   MRN: [id number]  HPI Hypertension- Pt denies chest pain, SOB, dizziness, or heart palpitations.  Taking meds as directed w/o problems.  Denies medication side effects.  Has been running in the 160 at home for the last couple of weeks.    Afib - he is on Digoxin, diltiazem and Tekturna. On Eliquis. He denies chest pain, short of breath, discomfort or palpitations.  GERD- on Protonix. Doing well.  He does take it regularly.  BPH- On flomax for prostate.   No palms or side effects. He feels like his symptoms are very well controlled.  Review of Systems     Objective:   Physical Exam  Constitutional: He is oriented to person, place, and time. He appears well-developed and well-nourished.  HENT:  Head: Normocephalic and atraumatic.  Cardiovascular: Normal rate, regular rhythm and normal heart sounds.   No carotid bruits.   Pulmonary/Chest: Effort normal and breath sounds normal.  Neurological: He is alert and oriented to person, place, and time.  Skin: Skin is warm and dry.  Psychiatric: He has a normal mood and affect. His behavior is normal.          Assessment & Plan:  HTN - repeat blood pressure was better but still elevated. We discussed avoiding excess salt. He did admit to eating a lot of ham recently. I would like him to come back in one week to recheck blood pressure make sure it's back to goal. If not then we may need to adjust his regimen.  afib- followed by cards. On eliquis. Currently asymptomatic. Due to recheck magnesium level since on digoxin.  GERD - Doing well on protonix. Continue current regimen. Due to check magnesium level since this can decrease over time especially when on PPIs for prolonged period of time.  BPH - AUA score of  1 today. Sxs well controlled on teh flomax.  Continue current regimen.  CKD-due to recheck kidney function. Will follow every 6 months.

## 2015-01-08 ENCOUNTER — Ambulatory Visit: Payer: Medicare Other | Admitting: Family Medicine

## 2015-01-11 ENCOUNTER — Encounter: Payer: Self-pay | Admitting: Family Medicine

## 2015-01-11 ENCOUNTER — Ambulatory Visit (INDEPENDENT_AMBULATORY_CARE_PROVIDER_SITE_OTHER): Payer: Medicare Other | Admitting: Family Medicine

## 2015-01-11 VITALS — BP 138/70

## 2015-01-11 DIAGNOSIS — I10 Essential (primary) hypertension: Secondary | ICD-10-CM | POA: Diagnosis not present

## 2015-01-11 DIAGNOSIS — N182 Chronic kidney disease, stage 2 (mild): Secondary | ICD-10-CM | POA: Diagnosis not present

## 2015-01-11 NOTE — Progress Notes (Signed)
   Subjective:    Patient ID: Nicholas Cruz, male    DOB: May 15, 1931, 81 y.o.   MRN: [id number]  HPI  Here for follow-up blood pressure today. He did record his blood pressures at home from January 15 until January 24. He mostly checks it in the morning. Pulses ranged in the upper 50s to 80s. He did have 3 days where his blood pressure was 150 or higher. The highest pressure was 162/74. It was elevated when he came into her office about 2 weeks ago as he had been eating a lot of salt around that time.  Review of Systems     Objective:   Physical Exam  Constitutional: He is oriented to person, place, and time. He appears well-developed and well-nourished.  Neurological: He is alert and oriented to person, place, and time.  Psychiatric: He has a normal mood and affect. His behavior is normal.          Assessment & Plan:  Hypertension-looks much better today compared to previous. Since the last time I saw him he did have 2 elevated blood pressures at home. But most of them were in the 130s and 140s. Based on the newer guidelines is acceptable for him to run under 150 that there are some guidelines that still recommend with heart disease and kidney disease that he maintain a blood pressure under 140. We'll continue to monitor very carefully over the next few months. Will not make any adjustments to his regimen. Continue to avoid excess salt which I do think is a big trigger for him. Did remind him to go to the lab for his blood work since he is not getting labs with his cardiologist until May.

## 2015-01-12 LAB — COMPLETE METABOLIC PANEL WITH GFR
ALT: 22 U/L (ref 0–53)
AST: 28 U/L (ref 0–37)
Albumin: 4.1 g/dL (ref 3.5–5.2)
Alkaline Phosphatase: 68 U/L (ref 39–117)
BUN: 16 mg/dL (ref 6–23)
CO2: 31 mEq/L (ref 19–32)
Calcium: 10 mg/dL (ref 8.4–10.5)
Chloride: 98 mEq/L (ref 96–112)
Creat: 1.21 mg/dL (ref 0.50–1.35)
GFR, Est African American: 65 mL/min
GFR, Est Non African American: 56 mL/min — ABNORMAL LOW
Glucose, Bld: 73 mg/dL (ref 70–99)
Potassium: 4.5 mEq/L (ref 3.5–5.3)
Sodium: 141 mEq/L (ref 135–145)
Total Bilirubin: 0.8 mg/dL (ref 0.2–1.2)
Total Protein: 7.2 g/dL (ref 6.0–8.3)

## 2015-01-12 LAB — MAGNESIUM: Magnesium: 1.9 mg/dL (ref 1.5–2.5)

## 2015-01-14 DIAGNOSIS — H2513 Age-related nuclear cataract, bilateral: Secondary | ICD-10-CM | POA: Diagnosis not present

## 2015-01-14 DIAGNOSIS — H3531 Nonexudative age-related macular degeneration: Secondary | ICD-10-CM | POA: Diagnosis not present

## 2015-01-18 ENCOUNTER — Telehealth: Payer: Self-pay | Admitting: *Deleted

## 2015-01-18 NOTE — Telephone Encounter (Signed)
Pt given # to billing [phone].Nicholas Cruz [place]

## 2015-02-22 ENCOUNTER — Encounter: Payer: Self-pay | Admitting: Family Medicine

## 2015-02-22 ENCOUNTER — Ambulatory Visit (INDEPENDENT_AMBULATORY_CARE_PROVIDER_SITE_OTHER): Payer: Medicare Other | Admitting: Family Medicine

## 2015-02-22 VITALS — BP 144/78 | HR 88 | Wt 186.0 lb

## 2015-02-22 DIAGNOSIS — H9313 Tinnitus, bilateral: Secondary | ICD-10-CM | POA: Diagnosis not present

## 2015-02-22 DIAGNOSIS — H6123 Impacted cerumen, bilateral: Secondary | ICD-10-CM

## 2015-02-22 NOTE — Progress Notes (Signed)
   Subjective:    Patient ID: Nicholas Cruz, male    DOB: 04-15-1931, 81 y.o.   MRN: [id number]  HPI Both ear ringing and decreased hearing for the last 3-4 days. He's been using peroxide and suite all in his ears to help. No fever, pain, or ear drainage. No reports that her symptoms.   Review of Systems     Objective:   Physical Exam  Constitutional: He is oriented to person, place, and time. He appears well-developed and well-nourished.  HENT:  Head: Normocephalic and atraumatic.  Right Ear: External ear normal.  Left Ear: External ear normal.  Nose: Nose normal.  TMs and canals are clear. Left TM, mildly erythematous.    Eyes: Conjunctivae are normal. Pupils are equal, round, and reactive to light.  Cardiovascular: Regular rhythm.   Neurological: He is alert and oriented to person, place, and time.  Skin: Skin is warm and dry.  Psychiatric: He has a normal mood and affect.          Assessment & Plan:  Tinnitus - improved after irrigation.  Call if sxs return or not feeling better.   Hearing loss secondary to cerumen impraction.  - improved after irrigation.

## 2015-02-23 ENCOUNTER — Other Ambulatory Visit: Payer: Self-pay

## 2015-02-23 MED ORDER — DILTIAZEM HCL ER COATED BEADS 240 MG PO CP24: ORAL_CAPSULE | ORAL | Status: DC

## 2015-02-23 MED ORDER — POTASSIUM CHLORIDE CRYS ER 20 MEQ PO TBCR: EXTENDED_RELEASE_TABLET | ORAL | Status: DC

## 2015-02-23 MED ORDER — DIGOXIN 125 MCG PO TABS: 125.0000 ug | ORAL_TABLET | Freq: Every day | ORAL | Status: DC

## 2015-02-23 MED ORDER — APIXABAN 5 MG PO TABS: 5.0000 mg | ORAL_TABLET | Freq: Two times a day (BID) | ORAL | Status: DC

## 2015-02-23 MED ORDER — PANTOPRAZOLE SODIUM 40 MG PO TBEC: 40.0000 mg | DELAYED_RELEASE_TABLET | Freq: Every day | ORAL | Status: DC

## 2015-02-23 MED ORDER — PRAVASTATIN SODIUM 80 MG PO TABS: 80.0000 mg | ORAL_TABLET | Freq: Every day | ORAL | Status: DC

## 2015-03-10 ENCOUNTER — Telehealth: Payer: Self-pay | Admitting: *Deleted

## 2015-03-10 NOTE — Telephone Encounter (Signed)
Pt called and stated that he has been receiving calls from [facility] about medications and he nor his wife no longer use [facility]. I told him that according to our records we changed his and and his wife's pharmacy to [facility] and there hasn't been anything that has been sent for either one of them to [facility].Audelia Hives [place]

## 2015-05-13 ENCOUNTER — Other Ambulatory Visit (INDEPENDENT_AMBULATORY_CARE_PROVIDER_SITE_OTHER): Payer: Medicare Other | Admitting: *Deleted

## 2015-05-13 DIAGNOSIS — E782 Mixed hyperlipidemia: Secondary | ICD-10-CM | POA: Diagnosis not present

## 2015-05-13 LAB — LIPID PANEL
Cholesterol: 154 mg/dL (ref 0–200)
HDL: 35.3 mg/dL — ABNORMAL LOW (ref >39.00)
NonHDL: 118.7
Total CHOL/HDL Ratio: 4
Triglycerides: 244 mg/dL — ABNORMAL HIGH (ref 0.0–149.0)
VLDL: 48.8 mg/dL — ABNORMAL HIGH (ref 0.0–40.0)

## 2015-05-13 LAB — LDL CHOLESTEROL, DIRECT: Direct LDL: 86 mg/dL

## 2015-05-13 LAB — HEPATIC FUNCTION PANEL
ALT: 28 U/L (ref 0–53)
AST: 30 U/L (ref 0–37)
Albumin: 3.9 g/dL (ref 3.5–5.2)
Alkaline Phosphatase: 71 U/L (ref 39–117)
Bilirubin, Direct: 0.1 mg/dL (ref 0.0–0.3)
Total Bilirubin: 0.8 mg/dL (ref 0.2–1.2)
Total Protein: 7.1 g/dL (ref 6.0–8.3)

## 2015-05-21 ENCOUNTER — Telehealth: Payer: Self-pay | Admitting: Family Medicine

## 2015-05-21 NOTE — Telephone Encounter (Signed)
Called pt and lvm informing him that this med is not on his list. He called back and said this was a mistake.Audelia Hives [place]

## 2015-05-21 NOTE — Telephone Encounter (Signed)
Pt left vm. He wants script for 100 Accu-chek strips.  Thank you.

## 2015-06-11 ENCOUNTER — Ambulatory Visit: Payer: Medicare Other | Admitting: Family Medicine

## 2015-06-28 ENCOUNTER — Other Ambulatory Visit: Payer: Self-pay | Admitting: Family Medicine

## 2015-07-23 ENCOUNTER — Encounter: Payer: Self-pay | Admitting: Family Medicine

## 2015-07-23 ENCOUNTER — Ambulatory Visit (INDEPENDENT_AMBULATORY_CARE_PROVIDER_SITE_OTHER): Payer: Medicare Other | Admitting: Family Medicine

## 2015-07-23 VITALS — BP 154/83 | HR 87 | Ht 67.0 in | Wt 184.0 lb

## 2015-07-23 DIAGNOSIS — C61 Malignant neoplasm of prostate: Secondary | ICD-10-CM

## 2015-07-23 DIAGNOSIS — I10 Essential (primary) hypertension: Secondary | ICD-10-CM

## 2015-07-23 DIAGNOSIS — I4891 Unspecified atrial fibrillation: Secondary | ICD-10-CM

## 2015-07-23 NOTE — Progress Notes (Signed)
   Subjective:    Patient ID: Nicholas Cruz, male    DOB: 22-Jul-1931, 81 y.o.   MRN: [id number]  HPI Hypertension- Pt denies chest pain, SOB, dizziness, or heart palpitations.  Taking meds as directed w/o problems.  Denies medication side effects.  Checks BP at home. Mostly in the 130s and a few in the 140s.   Atrial fibrillation-he's currently on Eliquis and digoxin. He does follow with cardiology.No nosebleeds.     Review of Systems     Objective:   Physical Exam  Constitutional: He is oriented to person, place, and time. He appears well-developed and well-nourished.  HENT:  Head: Normocephalic and atraumatic.  Cardiovascular: Normal rate, regular rhythm and normal heart sounds.   Pulmonary/Chest: Effort normal and breath sounds normal.  Neurological: He is alert and oriented to person, place, and time.  Skin: Skin is warm and dry.  Psychiatric: He has a normal mood and affect. His behavior is normal.          Assessment & Plan:  HTN -  elevated today. We were actually going to check his blood pressure again before he left but forgot before he went to the lab. Again he has a follow up with his cardiologist and I encouraged his to check his blood pressure is a little bit more frequently at home so that he can take a record of those to his cardiologist for review.  Atrial fibrillation  - rate is well controlled. And he is asymptomatic which is fantastic. He thinks he has a follow-up with his cardiologist coming up soon. It looks like his blood work is up-to-date from the end of May including a lipid panel.  He requested to have prostate cancer screening. We discussed some of the pros and cons and based on his age whether not he still wanted to continue to do so. He opted to do the blood PSA test today and says neck sure he will just do the finger test.

## 2015-07-24 LAB — PSA: PSA: 2.2 ng/mL (ref <=4.00)

## 2015-07-28 ENCOUNTER — Telehealth: Payer: Self-pay | Admitting: Family Medicine

## 2015-07-28 NOTE — Telephone Encounter (Signed)
Patient dropped off home blood pressure log that he's been tracking her the last few days. Best blood pressure was 131/59. Has pressure was 141/65. We were concerned because his blood pressure was high the other day in the office that he reported good blood pressures at home.  Call pt:  These are acceptable especially based on age. Still continue to track blood pressures and discuss with cardiology at follow-up next month.

## 2015-07-29 NOTE — Telephone Encounter (Signed)
Pt informed of recommendations.Cord Wilczynski [place]  

## 2015-09-01 ENCOUNTER — Other Ambulatory Visit: Payer: Self-pay

## 2015-09-01 ENCOUNTER — Other Ambulatory Visit: Payer: Self-pay | Admitting: Interventional Cardiology

## 2015-09-01 ENCOUNTER — Other Ambulatory Visit: Payer: Self-pay | Admitting: Family Medicine

## 2015-09-01 MED ORDER — ALISKIREN-HYDROCHLOROTHIAZIDE 150-25 MG PO TABS: 1.0000 | ORAL_TABLET | Freq: Every day | ORAL | Status: DC

## 2015-09-23 ENCOUNTER — Encounter: Payer: Self-pay | Admitting: Interventional Cardiology

## 2015-10-01 ENCOUNTER — Encounter: Payer: Self-pay | Admitting: Interventional Cardiology

## 2015-10-01 ENCOUNTER — Ambulatory Visit (INDEPENDENT_AMBULATORY_CARE_PROVIDER_SITE_OTHER): Payer: Medicare Other | Admitting: Interventional Cardiology

## 2015-10-01 VITALS — BP 154/78 | HR 80 | Ht 67.0 in | Wt 181.8 lb

## 2015-10-01 DIAGNOSIS — I482 Chronic atrial fibrillation, unspecified: Secondary | ICD-10-CM

## 2015-10-01 DIAGNOSIS — I10 Essential (primary) hypertension: Secondary | ICD-10-CM | POA: Diagnosis not present

## 2015-10-01 DIAGNOSIS — E785 Hyperlipidemia, unspecified: Secondary | ICD-10-CM

## 2015-10-01 DIAGNOSIS — E781 Pure hyperglyceridemia: Secondary | ICD-10-CM

## 2015-10-01 DIAGNOSIS — M2170 Unequal limb length (acquired), unspecified site: Secondary | ICD-10-CM

## 2015-10-01 DIAGNOSIS — IMO0002 Reserved for concepts with insufficient information to code with codable children: Secondary | ICD-10-CM

## 2015-10-01 NOTE — Patient Instructions (Signed)
**Note De-Identified Nicholas Cruz Obfuscation** Medication Instructions:  Increase Fish oil to 1000 mg twice daily-All other medications remain the same  Labwork: Lipids and CMET in 6 months. Please do not eat or drink after midnight the night before labs are drawm.  Testing/Procedures: None  Follow-Up: Your physician wants you to follow-up in: 1 year. You will receive a reminder letter in the mail two months in advance. If you don't receive a letter, please call our office to schedule the follow-up appointment.

## 2015-10-01 NOTE — Progress Notes (Signed)
Patient ID: Nicholas Cruz, male   DOB: 08/05/1931, 81 y.o.   MRN: [id number]     Cardiology Office Note   Date:  10/01/2015   ID:  GIL INGWERSEN, DOB [birth year], MRN [id number]  PCP:  Beatrice Lecher, MD    No chief complaint on file. AFib   Wt Readings from Last 3 Encounters:  10/01/15 181 lb 12.8 oz (82.464 kg)  07/23/15 184 lb (83.462 kg)  02/22/15 186 lb (84.369 kg)       History of Present Illness: Nicholas Cruz is a 80 y.o. male  who has had atrial fibrillation. He was on an antiarrhythmic in the past.  He does not report any recent bleeding problems. His blood pressure has been a little bit higher. Typically, systolics are still below 150, but the readings are running more in the high 130s and 140s which is higher than what it previously was running.  He continues to feel well.  Atrial Fibrillation F/U:  c/o Leg edema more at the end of the day.  Denies : Chest pain.  Dizziness.  Orthopnea.  Palpitations.  Syncope.   His wife has Alzheimer's disease. He is a primary caretaker for her. She also had recent back surgery and she is supposed to avoid strenuous movements. She is not following instructions and this is a great source of stress for him.    Past Medical History  Diagnosis Date  . Duodenal ulcer 1968  . Colorectal polyps 1985  . H/O knee surgery 1990  . Atrial fibrillation ([place]) 2008  . History of pulmonary embolism 2007    Past Surgical History  Procedure Laterality Date  . Knee surgery  1990    [facility]     Current Outpatient Prescriptions  Medication Sig Dispense Refill  . Aliskiren-Hydrochlorothiazide (TEKTURNA HCT) 150-25 MG TABS Take 1 tablet by mouth at bedtime. 90 each 4  . apixaban (ELIQUIS) 5 MG TABS tablet Take 1 tablet (5 mg total) by mouth 2 (two) times daily. 180 tablet 2  . clidinium-chlordiazePOXIDE (LIBRAX) 5-2.5 MG per capsule TAKE ONE CAPSULE BY MOUTH THREE TIMES DAILY AS NEEDED FOR STOMACH 270 capsule 0  . clindamycin (CLEOCIN  T) 1 % lotion Apply 1 application topically 2 (two) times daily.    . digoxin (DIGOX) 0.125 MG tablet Take 1 tablet (125 mcg total) by mouth daily. 90 tablet 2  . diltiazem (CARDIZEM CD) 240 MG 24 hr capsule TAKE (1) CAPSULE DAILY. 90 capsule 2  . mometasone (NASONEX) 50 MCG/ACT nasal spray Place 2 sprays into the nose daily.    . Omega-3 Fatty Acids (FISH OIL) 1000 MG CAPS Take 1,000 mg by mouth 2 (two) times daily.    . pantoprazole (PROTONIX) 40 MG tablet Take 1 tablet (40 mg total) by mouth daily. 90 tablet 2  . potassium chloride SA (K-DUR,KLOR-CON) 20 MEQ tablet Take 1 tablet (20 mEq total) by mouth daily. 90 tablet 2  . pravastatin (PRAVACHOL) 80 MG tablet Take 1 tablet (80 mg total) by mouth daily. 90 tablet 2  . tamsulosin (FLOMAX) 0.4 MG CAPS capsule TAKE 1 CAPSULE BY MOUTH EVERY DAY 90 capsule 0  . triamcinolone ointment (KENALOG) 0.5 % Apply 1 application topically daily. 15 g 0  . [DISCONTINUED] diltiazem (DILACOR XR) 240 MG 24 hr capsule TAKE (1) CAPSULE DAILY. 30 capsule 1   No current facility-administered medications for this visit.    Allergies:   Pradaxa    Social History:  The patient  reports that he  has never smoked. He does not have any smokeless tobacco history on file. He reports that he does not drink alcohol or use illicit drugs.   Family History:  The patient's family history includes Cancer - Lung in his brother; Heart attack in his father.    ROS:  Please see the history of present illness.   Otherwise, review of systems are positive for stress due to his wife's condition.   All other systems are reviewed and negative.    PHYSICAL EXAM: VS:  BP 154/78 mmHg  Pulse 80  Ht 5' 7" (1.702 m)  Wt 181 lb 12.8 oz (82.464 kg)  BMI 28.47 kg/m2 , BMI Body mass index is 28.47 kg/(m^2). GEN: Well nourished, well developed, in no acute distress HEENT: normal Neck: no JVD, carotid bruits, or masses Cardiac: Irregularly irregular; no murmurs, rubs, or gallops,no edema    Respiratory:  clear to auscultation bilaterally, normal work of breathing GI: soft, nontender, nondistended, + BS MS: no deformity or atrophy, Right leg slightly larger than left leg-chronic Skin: warm and dry, no rash Neuro:  Strength and sensation are intact Psych: euthymic mood, full affect   EKG:   The ekg ordered today demonstrates atrial fibrillation, rate controlled, nonspecific ST segment changes   Recent Labs: 01/11/2015: BUN 16; Creat 1.21; Magnesium 1.9; Potassium 4.5; Sodium 141 05/13/2015: ALT 28   Lipid Panel    Component Value Date/Time   CHOL 154 05/13/2015 0739   TRIG 244.0* 05/13/2015 0739   HDL 35.30* 05/13/2015 0739   CHOLHDL 4 05/13/2015 0739   VLDL 48.8* 05/13/2015 0739   LDLCALC 94 05/21/2014 1224   LDLDIRECT 86.0 05/13/2015 0739     Other studies Reviewed: Additional studies/ records that were reviewed today with results demonstrating: .   ASSESSMENT AND PLAN:  1. AFib: Rate controlled. No symptoms. Ellik was for anticoagulation. No bleeding issues.  2. HTN:  Blood pressure still an acceptable range. I don't think we have to change medications at this time. He will let us know if his systolics tend to run consistently over 150. If so, would likely increase Cardizem to 300 mg daily. 3. Increased TG: Increase fish oil to 1000 mg BID.  Recheck lipids in about 6 months. I gave him the option of following up with her nurse the office since I would be closer for him. He will consider this option. 4. He will need digoxin level followed at least annually.   Current medicines are reviewed at length with the patient today.  The patient concerns regarding his medicines were addressed.  The following changes have been made:  Increase fish oil  Labs/ tests ordered today include:   Orders Placed This Encounter  Procedures  . Lipid Profile  . Comp Met (CMET)  . EKG 12-Lead    Recommend 150 minutes/week of aerobic exercise Low fat, low carb, high fiber  diet recommended  Disposition:   FU in one year   Teresita Madura., MD  10/01/2015 10:43 AM    [facility] [place], [place], [place]  [address] Phone: [phone]; Fax: [phone]

## 2015-11-28 ENCOUNTER — Other Ambulatory Visit: Payer: Self-pay | Admitting: Family Medicine

## 2015-12-15 ENCOUNTER — Other Ambulatory Visit: Payer: Self-pay | Admitting: *Deleted

## 2015-12-15 NOTE — Telephone Encounter (Signed)
HAVING ALL ACTIVE REFILLS MOVED TO [facility]. CHANGED IN THE SYSTEM.

## 2015-12-23 ENCOUNTER — Other Ambulatory Visit: Payer: Self-pay | Admitting: Interventional Cardiology

## 2015-12-23 MED ORDER — ALISKIREN-HYDROCHLOROTHIAZIDE 150-25 MG PO TABS: 1.0000 | ORAL_TABLET | Freq: Every day | ORAL | Status: DC

## 2015-12-23 MED ORDER — PANTOPRAZOLE SODIUM 40 MG PO TBEC: 40.0000 mg | DELAYED_RELEASE_TABLET | Freq: Every day | ORAL | Status: DC

## 2015-12-23 MED ORDER — PRAVASTATIN SODIUM 80 MG PO TABS: 80.0000 mg | ORAL_TABLET | Freq: Every day | ORAL | Status: DC

## 2015-12-23 MED ORDER — APIXABAN 5 MG PO TABS: 5.0000 mg | ORAL_TABLET | Freq: Two times a day (BID) | ORAL | Status: DC

## 2015-12-23 MED ORDER — DIGOXIN 125 MCG PO TABS: 125.0000 ug | ORAL_TABLET | Freq: Every day | ORAL | Status: DC

## 2015-12-23 MED ORDER — DILTIAZEM HCL ER COATED BEADS 240 MG PO CP24: ORAL_CAPSULE | ORAL | Status: DC

## 2015-12-23 MED ORDER — POTASSIUM CHLORIDE CRYS ER 20 MEQ PO TBCR: EXTENDED_RELEASE_TABLET | ORAL | Status: DC

## 2015-12-29 ENCOUNTER — Other Ambulatory Visit: Payer: Self-pay

## 2015-12-29 MED ORDER — TAMSULOSIN HCL 0.4 MG PO CAPS: 0.4000 mg | ORAL_CAPSULE | Freq: Every day | ORAL | Status: DC

## 2016-01-05 ENCOUNTER — Telehealth: Payer: Self-pay | Admitting: Interventional Cardiology

## 2016-01-05 NOTE — Telephone Encounter (Signed)
f/u    Pt waiting on call back concerning prior auth.Marland Kitchen

## 2016-01-05 NOTE — Telephone Encounter (Signed)
Pt calling stating that his pharmacy [facility] stated that he needed a prior auth for his medication Aliskiren-HCT 150-25 mg tablet. Please advise

## 2016-01-06 ENCOUNTER — Telehealth: Payer: Self-pay

## 2016-01-06 NOTE — Telephone Encounter (Signed)
Prior auth for Tekturna 150-25 sent to [facility]

## 2016-01-06 NOTE — Telephone Encounter (Signed)
Attempted to call patient. Phone rings continuously. I have sent a PA in for Tekturna 150-25

## 2016-01-07 ENCOUNTER — Telehealth: Payer: Self-pay | Admitting: Interventional Cardiology

## 2016-01-07 DIAGNOSIS — E785 Hyperlipidemia, unspecified: Secondary | ICD-10-CM

## 2016-01-07 NOTE — Telephone Encounter (Signed)
The pt is advised that Jenean Lindau, LPN who handles prior authorizations in this office has received a message from the pts insurance company concerning his Marisa Severin and is working on this matter for the pt. He is advised that once a decision has been made Vaughan Basta will notify him with decision. He verbalized understanding and thanked me for my help.  Will forward message to Dasher.

## 2016-01-07 NOTE — Telephone Encounter (Signed)
New message    Patient calling    Pt c/o medication issue:  1. Name of Medication: Tekturna 150.25 mg   2. How are you currently taking this medication (dosage and times per day)? Once a day    3. Are you having a reaction (difficulty breathing--STAT)? No   4. What is your medication issue? Insurance company saying medication in not cover - [employer]. HMO 1.

## 2016-01-10 ENCOUNTER — Telehealth: Payer: Self-pay

## 2016-01-10 NOTE — Telephone Encounter (Signed)
Nicholas Cruz will not be covered by [facility]. List of alternatives given to Dr. Irish Lack.

## 2016-02-07 ENCOUNTER — Telehealth: Payer: Self-pay

## 2016-02-07 NOTE — Telephone Encounter (Signed)
Nicholas Cruz is not on patient's preferred list. He must do step therapy. Actually needs to try 5 alternatives. Candesartan-Hctz,irbesartan-hctz, Losartan-hctz, Telmisartan- hctz, Valsartan-hctz, Benicar-hctz.

## 2016-02-08 MED ORDER — IRBESARTAN-HYDROCHLOROTHIAZIDE 150-12.5 MG PO TABS: 1.0000 | ORAL_TABLET | Freq: Every day | ORAL | Status: DC

## 2016-02-08 NOTE — Telephone Encounter (Signed)
THank you!

## 2016-02-08 NOTE — Telephone Encounter (Signed)
Equivalent dose of aliskiren 105mg  is irbesartan 150mg , however Avalide combo does not come in irbesartan HCTZ 150-25mg . Closest is irbesartan HCTZ 150-12.5mg . Rx sent to [facility]. Spoke with pt on the phone - reports his shipment comes in ~1 week after rx is sent. Scheduled pt in pharmacy clinic to check BP and BMET 1 week after he switches from [place] to [place].

## 2016-02-08 NOTE — Telephone Encounter (Signed)
OK to switch to equivalent dose of irbesartan-HCTZ.  I wil check with pharmacy to see what that is.  BMet one week after switch.

## 2016-02-24 ENCOUNTER — Ambulatory Visit (INDEPENDENT_AMBULATORY_CARE_PROVIDER_SITE_OTHER): Payer: Medicare Other | Admitting: Pharmacist

## 2016-02-24 VITALS — BP 170/90 | HR 72

## 2016-02-24 DIAGNOSIS — I10 Essential (primary) hypertension: Secondary | ICD-10-CM | POA: Diagnosis not present

## 2016-02-24 LAB — BASIC METABOLIC PANEL
BUN: 20 mg/dL (ref 7–25)
CO2: 26 mmol/L (ref 20–31)
Calcium: 9.1 mg/dL (ref 8.6–10.3)
Chloride: 102 mmol/L (ref 98–110)
Creat: 1.1 mg/dL (ref 0.70–1.11)
Glucose, Bld: 153 mg/dL — ABNORMAL HIGH (ref 65–99)
Potassium: 4.2 mmol/L (ref 3.5–5.3)
Sodium: 138 mmol/L (ref 135–146)

## 2016-02-24 NOTE — Progress Notes (Signed)
Patient ID: Nicholas Cruz, male   DOB: [birth year], 82 y.o.   MRN: [id number]       Date:  02/24/2016   ID:  Nicholas Cruz, DOB 03-18-1931, MRN [id number]  PCP:  Beatrice Lecher, MD   History of Present Illness: Nicholas Cruz is a 82 y.o. male  Patient of Dr. Irish Lack who is here today for BP check.  Pt has been well controlled with diltiazem 240mg  daily and Tekturna HCT 150/25.  He called on 2/20 to report issues with getting Tekturna covered so he was changed to Avalide 150/12.5mg .      Pt reports he has been taking Avalide for a little over a week now.  He states he had some feelings of unsteadiness the first day but no issues since then.  He has chronic dry mouth and this has not changed with the medication adjustments.  He has been checking his BP at home.  He says it averages around 150 with a high of 155 and a low of 133.  He did not bring his cuff in today but he did say he had it verified at a pharmacy for accuracy in the past.   BP goal: <150/90  Current Medications: irbesartan/HCTZ 150/12.5mg  once daily and diltiazem 240mg  daily  BP Readings from Last 3 Encounters:  02/24/16 170/90  10/01/15 154/78  07/23/15 154/83     Past Medical History  Diagnosis Date  . Duodenal ulcer 1968  . Colorectal polyps 1985  . H/O knee surgery 1990  . Atrial fibrillation ([place]) 2008  . History of pulmonary embolism 2007    Past Surgical History  Procedure Laterality Date  . Knee surgery  1990    [facility]     Current Outpatient Prescriptions  Medication Sig Dispense Refill  . apixaban (ELIQUIS) 5 MG TABS tablet Take 1 tablet (5 mg total) by mouth 2 (two) times daily. 180 tablet 3  . clidinium-chlordiazePOXIDE (LIBRAX) 5-2.5 MG per capsule TAKE ONE CAPSULE BY MOUTH THREE TIMES DAILY AS NEEDED FOR STOMACH 270 capsule 0  . clindamycin (CLEOCIN T) 1 % lotion Apply 1 application topically 2 (two) times daily.    . digoxin (DIGOX) 0.125 MG tablet Take 1 tablet (125 mcg total) by mouth daily.  90 tablet 3  . diltiazem (CARDIZEM CD) 240 MG 24 hr capsule TAKE (1) CAPSULE DAILY. 90 capsule 3  . irbesartan-hydrochlorothiazide (AVALIDE) 150-12.5 MG tablet Take 1 tablet by mouth daily. 90 tablet 3  . mometasone (NASONEX) 50 MCG/ACT nasal spray Place 2 sprays into the nose daily.    . Omega-3 Fatty Acids (FISH OIL) 1000 MG CAPS Take 1,000 mg by mouth 2 (two) times daily.    . pantoprazole (PROTONIX) 40 MG tablet Take 1 tablet (40 mg total) by mouth daily. 90 tablet 3  . potassium chloride SA (K-DUR,KLOR-CON) 20 MEQ tablet Take 1 tablet (20 mEq total) by mouth daily. 90 tablet 3  . pravastatin (PRAVACHOL) 80 MG tablet Take 1 tablet (80 mg total) by mouth daily. 90 tablet 3  . tamsulosin (FLOMAX) 0.4 MG CAPS capsule Take 1 capsule (0.4 mg total) by mouth daily. 90 capsule 1  . triamcinolone ointment (KENALOG) 0.5 % Apply 1 application topically daily. 15 g 0  . [DISCONTINUED] diltiazem (DILACOR XR) 240 MG 24 hr capsule TAKE (1) CAPSULE DAILY. 30 capsule 1   No current facility-administered medications for this visit.     ASSESSMENT AND PLAN:  1. Hypertension:  Pt's BP elevated in clinic today but his  reported home readings have been at goal.  Will not increase medications today but have educated patient to call if his SBP consistently > 150mg /dL.  We also discussed potential side effects of Avalide and to call if he has any swelling in his face or tongue.  Will check BMET today as well.      Cyndee Brightly [facility]  02/24/2016 9:28 AM    [facility] [place], [place], [place]  [address] Phone: [phone]; Fax: [phone]

## 2016-02-24 NOTE — Patient Instructions (Signed)
Continue your current BP medications.   If you blood pressure if consistently more than 150 on the top number, please call Gay Filler at [phone].   We will call you tomorrow with your blood work results.

## 2016-03-10 ENCOUNTER — Other Ambulatory Visit: Payer: Self-pay | Admitting: Family Medicine

## 2016-03-10 NOTE — Telephone Encounter (Signed)
Is this ok to prescribe? It hasn't been sent since 06/2015. I looked it up and it says its for IBS but I didn't see this on problem list.

## 2016-03-13 ENCOUNTER — Telehealth: Payer: Self-pay | Admitting: *Deleted

## 2016-03-13 NOTE — Telephone Encounter (Signed)
Patient called and stated that [facility] recently quoted him a price of $125 for a 90 day supply of the eliquis. He received a call from them today stating that they need a prior authorization for the medication. The number that he provided to call was [phone].

## 2016-03-14 ENCOUNTER — Telehealth: Payer: Self-pay

## 2016-03-14 NOTE — Telephone Encounter (Signed)
Patient notified that med approved.

## 2016-03-14 NOTE — Telephone Encounter (Signed)
Eliquis approved through 12/17/2016. PA- [id number]. Patient notified.

## 2016-03-14 NOTE — Telephone Encounter (Signed)
Prior auth sent to [facility] today

## 2016-03-15 ENCOUNTER — Telehealth: Payer: Self-pay

## 2016-03-15 NOTE — Telephone Encounter (Signed)
Eliquis approved through 12/17/2016. [id number].

## 2016-03-21 ENCOUNTER — Telehealth: Payer: Self-pay

## 2016-03-21 DIAGNOSIS — Z8719 Personal history of other diseases of the digestive system: Secondary | ICD-10-CM

## 2016-03-21 DIAGNOSIS — Z8711 Personal history of peptic ulcer disease: Secondary | ICD-10-CM

## 2016-03-21 NOTE — Telephone Encounter (Signed)
Call pt: please clarify what taking it for? I don't see IBS on his list.

## 2016-03-22 DIAGNOSIS — Z8719 Personal history of other diseases of the digestive system: Secondary | ICD-10-CM | POA: Insufficient documentation

## 2016-03-22 DIAGNOSIS — Z8711 Personal history of peptic ulcer disease: Secondary | ICD-10-CM | POA: Insufficient documentation

## 2016-03-22 MED ORDER — RANITIDINE HCL 150 MG PO CAPS: 150.0000 mg | ORAL_CAPSULE | Freq: Two times a day (BID) | ORAL | Status: DC

## 2016-03-22 NOTE — Telephone Encounter (Signed)
Left information on Pt's VM. Callback provided for any questions.  

## 2016-03-22 NOTE — Telephone Encounter (Signed)
Pt states he is taking this for history of stomach ulcers and prevention of stomach ulcers. The Librax is no longer covered but his insurance, would like an alternative sent to mail order.

## 2016-03-22 NOTE — Telephone Encounter (Signed)
OK I sent over a prescription for ranitidine to his mail order.

## 2016-03-22 NOTE — Telephone Encounter (Signed)
He is taking the Librax for his stomach. He doesn't know why he is taking the medication. He thinks Dr Earle Gell prescribed it. He has been taking it for years. He is ok with trying to come off the medication.

## 2016-03-23 MED ORDER — RANITIDINE HCL 150 MG PO TABS: 150.0000 mg | ORAL_TABLET | Freq: Two times a day (BID) | ORAL | Status: DC

## 2016-03-23 NOTE — Telephone Encounter (Signed)
Ok to change ranitidine to tabs

## 2016-03-23 NOTE — Telephone Encounter (Signed)
Nicholas Cruz called back and state the medication just needs to be changed to tablets instead of capsules.

## 2016-03-23 NOTE — Telephone Encounter (Signed)
Switched medication to tablets.

## 2016-03-28 ENCOUNTER — Other Ambulatory Visit (INDEPENDENT_AMBULATORY_CARE_PROVIDER_SITE_OTHER): Payer: Medicare Other | Admitting: *Deleted

## 2016-03-28 DIAGNOSIS — E785 Hyperlipidemia, unspecified: Secondary | ICD-10-CM | POA: Diagnosis not present

## 2016-03-28 LAB — COMPREHENSIVE METABOLIC PANEL
ALT: 24 U/L (ref 9–46)
AST: 27 U/L (ref 10–35)
Albumin: 4 g/dL (ref 3.6–5.1)
Alkaline Phosphatase: 67 U/L (ref 40–115)
BUN: 13 mg/dL (ref 7–25)
CO2: 25 mmol/L (ref 20–31)
Calcium: 9.6 mg/dL (ref 8.6–10.3)
Chloride: 101 mmol/L (ref 98–110)
Creat: 1.01 mg/dL (ref 0.70–1.11)
Glucose, Bld: 138 mg/dL — ABNORMAL HIGH (ref 65–99)
Potassium: 5 mmol/L (ref 3.5–5.3)
Sodium: 138 mmol/L (ref 135–146)
Total Bilirubin: 0.7 mg/dL (ref 0.2–1.2)
Total Protein: 7.3 g/dL (ref 6.1–8.1)

## 2016-03-28 LAB — LIPID PANEL
Cholesterol: 180 mg/dL (ref 125–200)
HDL: 37 mg/dL — ABNORMAL LOW (ref >=40)
LDL Cholesterol: 107 mg/dL (ref <130)
Total CHOL/HDL Ratio: 4.9 Ratio (ref <=5.0)
Triglycerides: 179 mg/dL — ABNORMAL HIGH (ref <150)
VLDL: 36 mg/dL — ABNORMAL HIGH (ref <30)

## 2016-04-19 ENCOUNTER — Other Ambulatory Visit: Payer: Self-pay | Admitting: Family Medicine

## 2016-04-30 ENCOUNTER — Telehealth: Payer: Self-pay | Admitting: Family Medicine

## 2016-04-30 NOTE — Telephone Encounter (Signed)
Call pt: needs appt to be tested for diabetes.  Recent elevatd blood sugar.

## 2016-05-01 NOTE — Telephone Encounter (Signed)
lvm asking that pt call to schedule an appt.[name]

## 2016-05-04 NOTE — Telephone Encounter (Signed)
Nicholas Cruz called and states he has controlled his blood sugar with diet, per the nurse. He has been checking his blood sugars and the range is 85-125. He states he doesn't need to come in for a visit now that his has his blood sugars under control.

## 2016-07-12 ENCOUNTER — Other Ambulatory Visit: Payer: Self-pay | Admitting: Interventional Cardiology

## 2016-08-24 ENCOUNTER — Encounter: Payer: Self-pay | Admitting: Family Medicine

## 2016-08-24 ENCOUNTER — Telehealth: Payer: Self-pay | Admitting: Family Medicine

## 2016-08-24 ENCOUNTER — Ambulatory Visit (INDEPENDENT_AMBULATORY_CARE_PROVIDER_SITE_OTHER): Payer: Medicare Other | Admitting: Family Medicine

## 2016-08-24 VITALS — BP 142/57 | HR 96 | Wt 181.0 lb

## 2016-08-24 DIAGNOSIS — E1165 Type 2 diabetes mellitus with hyperglycemia: Secondary | ICD-10-CM

## 2016-08-24 DIAGNOSIS — Z0000 Encounter for general adult medical examination without abnormal findings: Secondary | ICD-10-CM | POA: Diagnosis not present

## 2016-08-24 DIAGNOSIS — Z125 Encounter for screening for malignant neoplasm of prostate: Secondary | ICD-10-CM | POA: Diagnosis not present

## 2016-08-24 DIAGNOSIS — N400 Enlarged prostate without lower urinary tract symptoms: Secondary | ICD-10-CM | POA: Diagnosis not present

## 2016-08-24 DIAGNOSIS — Z5181 Encounter for therapeutic drug level monitoring: Secondary | ICD-10-CM

## 2016-08-24 DIAGNOSIS — R7309 Other abnormal glucose: Secondary | ICD-10-CM

## 2016-08-24 DIAGNOSIS — R748 Abnormal levels of other serum enzymes: Secondary | ICD-10-CM

## 2016-08-24 DIAGNOSIS — IMO0001 Reserved for inherently not codable concepts without codable children: Secondary | ICD-10-CM

## 2016-08-24 DIAGNOSIS — E785 Hyperlipidemia, unspecified: Secondary | ICD-10-CM

## 2016-08-24 DIAGNOSIS — E781 Pure hyperglyceridemia: Secondary | ICD-10-CM

## 2016-08-24 DIAGNOSIS — H269 Unspecified cataract: Secondary | ICD-10-CM

## 2016-08-24 DIAGNOSIS — R7989 Other specified abnormal findings of blood chemistry: Secondary | ICD-10-CM

## 2016-08-24 LAB — POCT GLYCOSYLATED HEMOGLOBIN (HGB A1C): Hemoglobin A1C: 7.6

## 2016-08-24 NOTE — Progress Notes (Signed)
Subjective:   Nicholas Cruz is a 82 y.o. male who presents for Medicare Annual/Subsequent preventive examination.  Review of Systems:  Comprehensive ROS is negative.  No blood in the stool.         Objective:    Vitals: BP (!) 142/57 (BP Location: Left Arm, Patient Position: Sitting, Cuff Size: Normal)   Pulse 96   Wt 181 lb (82.1 kg)   SpO2 97%   BMI 28.35 kg/m   Body mass index is 28.35 kg/m.  Physical Exam  Constitutional: He is oriented to person, place, and time. He appears well-developed and well-nourished.  HENT:  Head: Normocephalic and atraumatic.  Right Ear: External ear normal.  Left Ear: External ear normal.  Nose: Nose normal.  Mouth/Throat: Oropharynx is clear and moist.  Eyes: Conjunctivae and EOM are normal. Pupils are equal, round, and reactive to light.  Neck: Normal range of motion. Neck supple. No thyromegaly present.  Cardiovascular: Normal rate, regular rhythm, normal heart sounds and intact distal pulses.   Pulmonary/Chest: Effort normal and breath sounds normal.  Abdominal: Soft. Bowel sounds are normal. He exhibits no distension and no mass. There is no tenderness. There is no rebound and no guarding.  Genitourinary:  Genitourinary Comments: Prostate is enlarged by symmetric with no nodules.   Musculoskeletal: Normal range of motion.  Lymphadenopathy:    He has no cervical adenopathy.  Neurological: He is alert and oriented to person, place, and time. He has normal reflexes.  Skin: Skin is warm and dry.  Psychiatric: He has a normal mood and affect. His behavior is normal. Judgment and thought content normal.    Tobacco History  Smoking Status  . Never Smoker  Smokeless Tobacco  . Not on file     Counseling given: Not Answered   Past Medical History:  Diagnosis Date  . Atrial fibrillation ([place]) 2008  . Colorectal polyps 1985  . Duodenal ulcer 1968  . H/O knee surgery 1990  . History of pulmonary embolism 2007   Past  Surgical History:  Procedure Laterality Date  . KNEE SURGERY  1990   [facility]   Family History  Problem Relation Age of Onset  . Cancer - Lung Brother   . Heart attack Father    History  Sexual Activity  . Sexual activity: Not Currently  . Partners: Female    Outpatient Encounter Prescriptions as of 08/24/2016  Medication Sig  . apixaban (ELIQUIS) 5 MG TABS tablet Take 1 tablet (5 mg total) by mouth 2 (two) times daily.  . clindamycin (CLEOCIN T) 1 % lotion Apply 1 application topically 2 (two) times daily.  . digoxin (DIGOX) 0.125 MG tablet Take 1 tablet (125 mcg total) by mouth daily.  Marland Kitchen diltiazem (CARDIZEM CD) 240 MG 24 hr capsule TAKE (1) CAPSULE DAILY.  Marland Kitchen irbesartan-hydrochlorothiazide (AVALIDE) 150-12.5 MG tablet Take 1 tablet by mouth daily.  . mometasone (NASONEX) 50 MCG/ACT nasal spray Place 2 sprays into the nose daily.  . Omega-3 Fatty Acids (FISH OIL) 1000 MG CAPS Take 1,000 mg by mouth 2 (two) times daily.  . pantoprazole (PROTONIX) 40 MG tablet Take 1 tablet (40 mg total) by mouth daily.  . potassium chloride SA (K-DUR,KLOR-CON) 20 MEQ tablet Take 1 tablet (20 mEq total) by mouth daily.  . pravastatin (PRAVACHOL) 80 MG tablet Take 1 tablet (80 mg total) by mouth daily.  . ranitidine (ZANTAC) 150 MG tablet Take 1 tablet (150 mg total) by mouth 2 (two) times daily.  Marland Kitchen  tamsulosin (FLOMAX) 0.4 MG CAPS capsule Take 1 capsule (0.4 mg total) by mouth daily. *NO ADDITIONAL REFILLS. PLEASE CALL OFFICE TO SCHEDULE APPOINTMENT*  . [DISCONTINUED] triamcinolone ointment (KENALOG) 0.5 % Apply 1 application topically daily.   No facility-administered encounter medications on file as of 08/24/2016.     Activities of Daily Living In your present state of health, do you have any difficulty performing the following activities: 08/24/2016  Hearing? N  Vision? N  Difficulty concentrating or making decisions? N  Walking or climbing stairs? N  Dressing or bathing? N  Doing errands, shopping? N   Some recent data might be hidden    Patient Care Team: Hali Marry, MD as PCP - General (Family Medicine) Jettie Booze, MD as Consulting Physician (Interventional Cardiology)   Assessment:    Medicare Wellness Exam  Exercise Activities and Dietary recommendations Current Exercise Habits: The patient has a physically strenous job, but has no regular exercise apart from work.  Goals    None     Fall Risk Fall Risk  08/24/2016 07/23/2015 06/22/2014  Falls in the past year? No No No   Depression Screen PHQ 2/9 Scores 08/24/2016 07/23/2015 06/22/2014  PHQ - 2 Score 0 0 0    Cognitive Testing No flowsheet data found.   6 CIT is normal today.   Immunization History  Administered Date(s) Administered  . Influenza Whole 09/17/2013  . Influenza, High Dose Seasonal PF 07/26/2016  . Influenza, Seasonal, Injecte, Preservative Fre 09/17/2014  . Influenza-Unspecified 09/30/2015  . Pneumococcal Conjugate-13 07/01/2014, 09/30/2015  . Pneumococcal-Unspecified 09/02/2012  . Tdap 12/02/2010  . Zoster 11/05/2007   Screening Tests Health Maintenance  Topic Date Due  . COLONOSCOPY  12/18/2014  . TETANUS/TDAP  12/02/2020  . INFLUENZA VACCINE  Completed  . ZOSTAVAX  Completed  . PNA vac Low Risk Adult  Completed      Plan:    During the course of the visit the patient was educated and counseled about the following appropriate screening and preventive services:   Vaccines UTD.  Cardiovascular Disease  Colorectal cancer screening  Diabetes  -  Lab Results  Component Value Date   HGBA1C 7.6 08/24/2016     Prostate Cancer Screening   Refer for cataract for [facility].    Nutrition counseling   Guaiac test was positive today. Last colonoscopy per old records was August 2011. He says it was done by Dr. Wynetta [facility]. Will call for copy of that report and also placed referral back to their office for possible repeat colonoscopy.  Patient Instructions  (the written plan) was given to the patient.    Jayquan Bradsher, MD  08/24/2016

## 2016-08-24 NOTE — Telephone Encounter (Signed)
Call patient: Please let him know that based on the fingerstick A1c that we did today that he actually does have diabetes. I would really like to discuss this further with him and talk about treatment plan and strategy. Please see if he can make an appointment in the next couple weeks if possible.

## 2016-08-24 NOTE — Patient Instructions (Signed)
Diabetes and Foot Care Diabetes may cause you to have problems because of poor blood supply (circulation) to your feet and legs. This may cause the skin on your feet to become thinner, break easier, and heal more slowly. Your skin may become dry, and the skin may peel and crack. You may also have nerve damage in your legs and feet causing decreased feeling in them. You may not notice minor injuries to your feet that could lead to infections or more serious problems. Taking care of your feet is one of the most important things you can do for yourself.  HOME CARE INSTRUCTIONS  Wear shoes at all times, even in the house. Do not go barefoot. Bare feet are easily injured.  Check your feet daily for blisters, cuts, and redness. If you cannot see the bottom of your feet, use a mirror or ask someone for help.  Wash your feet with warm water (do not use hot water) and mild soap. Then pat your feet and the areas between your toes until they are completely dry. Do not soak your feet as this can dry your skin.  Apply a moisturizing lotion or petroleum jelly (that does not contain alcohol and is unscented) to the skin on your feet and to dry, brittle toenails. Do not apply lotion between your toes.  Trim your toenails straight across. Do not dig under them or around the cuticle. File the edges of your nails with an emery board or nail file.  Do not cut corns or calluses or try to remove them with medicine.  Wear clean socks or stockings every day. Make sure they are not too tight. Do not wear knee-high stockings since they may decrease blood flow to your legs.  Wear shoes that fit properly and have enough cushioning. To break in new shoes, wear them for just a few hours a day. This prevents you from injuring your feet. Always look in your shoes before you put them on to be sure there are no objects inside.  Do not cross your legs. This may decrease the blood flow to your feet.  If you find a minor scrape,  cut, or break in the skin on your feet, keep it and the skin around it clean and dry. These areas may be cleansed with mild soap and water. Do not cleanse the area with peroxide, alcohol, or iodine.  When you remove an adhesive bandage, be sure not to damage the skin around it.  If you have a wound, look at it several times a day to make sure it is healing.  Do not use heating pads or hot water bottles. They may burn your skin. If you have lost feeling in your feet or legs, you may not know it is happening until it is too late.  Make sure your health care provider performs a complete foot exam at least annually or more often if you have foot problems. Report any cuts, sores, or bruises to your health care provider immediately. SEEK MEDICAL CARE IF:   You have an injury that is not healing.  You have cuts or breaks in the skin.  You have an ingrown nail.  You notice redness on your legs or feet.  You feel burning or tingling in your legs or feet.  You have pain or cramps in your legs and feet.  Your legs or feet are numb.  Your feet always feel cold. SEEK IMMEDIATE MEDICAL CARE IF:   There is increasing redness,  swelling, or pain in or around a wound.  There is a red line that goes up your leg.  Pus is coming from a wound.  You develop a fever or as directed by your health care provider.  You notice a bad smell coming from an ulcer or wound.   This information is not intended to replace advice given to you by your health care provider. Make sure you discuss any questions you have with your health care provider.   Document Released: 12/01/2000 Document Revised: 08/06/2013 Document Reviewed: 05/13/2013 Elsevier Interactive Patient Education 2016 [place] in the Home  Falls can cause injuries. They can happen to people of all ages. There are many things you can do to make your home safe and to help prevent falls.  WHAT CAN I DO ON THE OUTSIDE OF MY  HOME?  Regularly fix the edges of walkways and driveways and fix any cracks.  Remove anything that might make you trip as you walk through a door, such as a raised step or threshold.  Trim any bushes or trees on the path to your home.  Use bright outdoor lighting.  Clear any walking paths of anything that might make someone trip, such as rocks or tools.  Regularly check to see if handrails are loose or broken. Make sure that both sides of any steps have handrails.  Any raised decks and porches should have guardrails on the edges.  Have any leaves, snow, or ice cleared regularly.  Use sand or salt on walking paths during winter.  Clean up any spills in your garage right away. This includes oil or grease spills. WHAT CAN I DO IN THE BATHROOM?   Use night lights.  Install grab bars by the toilet and in the tub and shower. Do not use towel bars as grab bars.  Use non-skid mats or decals in the tub or shower.  If you need to sit down in the shower, use a plastic, non-slip stool.  Keep the floor dry. Clean up any water that spills on the floor as soon as it happens.  Remove soap buildup in the tub or shower regularly.  Attach bath mats securely with double-sided non-slip rug tape.  Do not have throw rugs and other things on the floor that can make you trip. WHAT CAN I DO IN THE BEDROOM?  Use night lights.  Make sure that you have a light by your bed that is easy to reach.  Do not use any sheets or blankets that are too big for your bed. They should not hang down onto the floor.  Have a firm chair that has side arms. You can use this for support while you get dressed.  Do not have throw rugs and other things on the floor that can make you trip. WHAT CAN I DO IN THE KITCHEN?  Clean up any spills right away.  Avoid walking on wet floors.  Keep items that you use a lot in easy-to-reach places.  If you need to reach something above you, use a strong step stool that has a  grab bar.  Keep electrical cords out of the way.  Do not use floor polish or wax that makes floors slippery. If you must use wax, use non-skid floor wax.  Do not have throw rugs and other things on the floor that can make you trip. WHAT CAN I DO WITH MY STAIRS?  Do not leave any items on the stairs.  Make sure that there are handrails on both sides of the stairs and use them. Fix handrails that are broken or loose. Make sure that handrails are as long as the stairways.  Check any carpeting to make sure that it is firmly attached to the stairs. Fix any carpet that is loose or worn.  Avoid having throw rugs at the top or bottom of the stairs. If you do have throw rugs, attach them to the floor with carpet tape.  Make sure that you have a light switch at the top of the stairs and the bottom of the stairs. If you do not have them, ask someone to add them for you. WHAT ELSE CAN I DO TO HELP PREVENT FALLS?  Wear shoes that:  Do not have high heels.  Have rubber bottoms.  Are comfortable and fit you well.  Are closed at the toe. Do not wear sandals.  If you use a stepladder:  Make sure that it is fully opened. Do not climb a closed stepladder.  Make sure that both sides of the stepladder are locked into place.  Ask someone to hold it for you, if possible.  Clearly mark and make sure that you can see:  Any grab bars or handrails.  First and last steps.  Where the edge of each step is.  Use tools that help you move around (mobility aids) if they are needed. These include:  Canes.  Walkers.  Scooters.  Crutches.  Turn on the lights when you go into a dark area. Replace any light bulbs as soon as they burn out.  Set up your furniture so you have a clear path. Avoid moving your furniture around.  If any of your floors are uneven, fix them.  If there are any pets around you, be aware of where they are.  Review your medicines with your doctor. Some medicines can make  you feel dizzy. This can increase your chance of falling. Ask your doctor what other things that you can do to help prevent falls.   This information is not intended to replace advice given to you by your health care provider. Make sure you discuss any questions you have with your health care provider.   Document Released: 09/30/2009 Document Revised: 04/20/2015 Document Reviewed: 01/08/2015 Elsevier Interactive Patient Education [employer].

## 2016-08-25 LAB — DIGOXIN LEVEL: Digoxin Level: 0.7 ug/L — ABNORMAL LOW (ref 0.8–2.0)

## 2016-08-25 LAB — BASIC METABOLIC PANEL WITH GFR
BUN: 21 mg/dL (ref 7–25)
CO2: 24 mmol/L (ref 20–31)
Calcium: 9.1 mg/dL (ref 8.6–10.3)
Chloride: 99 mmol/L (ref 98–110)
Creat: 1.27 mg/dL — ABNORMAL HIGH (ref 0.70–1.11)
GFR, Est African American: 60 mL/min (ref >=60)
GFR, Est Non African American: 52 mL/min — ABNORMAL LOW (ref >=60)
Glucose, Bld: 180 mg/dL — ABNORMAL HIGH (ref 65–99)
Potassium: 3.9 mmol/L (ref 3.5–5.3)
Sodium: 138 mmol/L (ref 135–146)

## 2016-08-25 LAB — PSA: PSA: 2.9 ng/mL (ref <=4.0)

## 2016-08-25 NOTE — Addendum Note (Signed)
Addended by: Teddy Spike on: 08/25/2016 09:11 AM   Modules accepted: Orders

## 2016-08-25 NOTE — Telephone Encounter (Signed)
Pt was advised of recommendations when given results.Nicholas Cruz'

## 2016-09-28 ENCOUNTER — Ambulatory Visit (INDEPENDENT_AMBULATORY_CARE_PROVIDER_SITE_OTHER): Payer: Medicare Other | Admitting: Interventional Cardiology

## 2016-09-28 ENCOUNTER — Encounter (INDEPENDENT_AMBULATORY_CARE_PROVIDER_SITE_OTHER): Payer: Self-pay

## 2016-09-28 ENCOUNTER — Encounter: Payer: Self-pay | Admitting: Interventional Cardiology

## 2016-09-28 VITALS — BP 141/60 | HR 84 | Ht 67.0 in | Wt 180.0 lb

## 2016-09-28 DIAGNOSIS — I10 Essential (primary) hypertension: Secondary | ICD-10-CM

## 2016-09-28 DIAGNOSIS — E782 Mixed hyperlipidemia: Secondary | ICD-10-CM

## 2016-09-28 DIAGNOSIS — I482 Chronic atrial fibrillation, unspecified: Secondary | ICD-10-CM

## 2016-09-28 NOTE — Patient Instructions (Signed)
**Note De-identified Nicholas Cruz Obfuscation** Medication Instructions:  Same-no changes  Labwork: None  Testing/Procedures: None  Follow-Up: Your physician wants you to follow-up in: 1 year. You will receive a reminder letter in the mail two months in advance. If you don't receive a letter, please call our office to schedule the follow-up appointment.      If you need a refill on your cardiac medications before your next appointment, please call your pharmacy.   

## 2016-09-28 NOTE — Progress Notes (Signed)
Patient ID: Nicholas Cruz, male   DOB: 19-Oct-1931, 83 y.o.   MRN: [id number]     Cardiology Office Note   Date:  09/28/2016   ID:  Nicholas Cruz, DOB [birth year], MRN [id number]  PCP:  Beatrice Lecher, MD    No chief complaint on file. AFib   Wt Readings from Last 3 Encounters:  09/28/16 180 lb (81.6 kg)  08/24/16 181 lb (82.1 kg)  10/01/15 181 lb 12.8 oz (82.5 kg)       History of Present Illness: Nicholas Cruz is a 82 y.o. male  who has had atrial fibrillation. He was on an antiarrhythmic in the past.  He does not report any recent bleeding problems. His blood pressure has been a little bit higher. Typically, systolics are still below 150, but the readings are running more in the high 130s and 140s.  He continues to feel well.  Atrial Fibrillation F/U:  c/o Leg edema more at the end of the day.  Denies : Chest pain.  Dizziness.  Orthopnea.  Palpitations.  Syncope.   His wife has Alzheimer's disease- although she does not admit it. He is a primary caretaker for her. She also had back surgery in 2016 and she was supposed to avoid strenuous movements. She is not following instructions and this is a great source of stress for him.  Her back was better, but her pain has returned.  He does the cooking at home.  He does a lot of wirk outside.  [place]    His blood sugar was elevated a little bit.  He changed his diet and his sugars came down.  Decreased bread intake.      Past Medical History:  Diagnosis Date  . Atrial fibrillation ([place]) 2008  . Colorectal polyps 1985  . Duodenal ulcer 1968  . H/O knee surgery 1990  . History of pulmonary embolism 2007    Past Surgical History:  Procedure Laterality Date  . KNEE SURGERY  1990   [facility]     Current Outpatient Prescriptions  Medication Sig Dispense Refill  . apixaban (ELIQUIS) 5 MG TABS tablet Take 1 tablet (5 mg total) by mouth 2 (two) times daily. 180 tablet 3  . digoxin (DIGOX) 0.125 MG tablet Take 1  tablet (125 mcg total) by mouth daily. 90 tablet 3  . diltiazem (CARDIZEM CD) 240 MG 24 hr capsule TAKE (1) CAPSULE DAILY. 90 capsule 3  . irbesartan-hydrochlorothiazide (AVALIDE) 150-12.5 MG tablet Take 1 tablet by mouth daily. 90 tablet 3  . mometasone (NASONEX) 50 MCG/ACT nasal spray Place 2 sprays into the nose daily.    . Omega-3 Fatty Acids (FISH OIL) 1000 MG CAPS Take 1,000 mg by mouth 2 (two) times daily.    . pantoprazole (PROTONIX) 40 MG tablet Take 1 tablet (40 mg total) by mouth daily. 90 tablet 3  . potassium chloride SA (K-DUR,KLOR-CON) 20 MEQ tablet Take 1 tablet (20 mEq total) by mouth daily. 90 tablet 3  . pravastatin (PRAVACHOL) 80 MG tablet Take 1 tablet (80 mg total) by mouth daily. 90 tablet 3  . ranitidine (ZANTAC) 150 MG tablet Take 1 tablet (150 mg total) by mouth 2 (two) times daily. 180 tablet 3  . tamsulosin (FLOMAX) 0.4 MG CAPS capsule Take 1 capsule (0.4 mg total) by mouth daily. *NO ADDITIONAL REFILLS. PLEASE CALL OFFICE TO SCHEDULE APPOINTMENT* 90 capsule 0   No current facility-administered medications for this visit.     Allergies:  Pradaxa [dabigatran etexilate mesylate]    Social History:  The patient  reports that he has never smoked. He does not have any smokeless tobacco history on file. He reports that he does not drink alcohol or use drugs.   Family History:  The patient's family history includes Cancer - Lung in his brother; Heart attack in his father.    ROS:  Please see the history of present illness.   Otherwise, review of systems are positive for stress due to his wife's condition.   All other systems are reviewed and negative.    PHYSICAL EXAM: VS:  BP (!) 141/60   Pulse 84   Ht 5\' 7"  (1.702 m)   Wt 180 lb (81.6 kg)   BMI 28.19 kg/m  , BMI Body mass index is 28.19 kg/m. GEN: Well nourished, well developed, in no acute distress  HEENT: normal  Neck: no JVD, carotid bruits, or masses Cardiac: Irregularly irregular; no murmurs, rubs, or  gallops,no edema  Respiratory:  clear to auscultation bilaterally, normal work of breathing GI: soft, nontender, nondistended, + BS MS: no deformity or atrophy , Right leg slightly larger than left leg-chronic Skin: warm and dry, no rash Neuro:  Strength and sensation are intact Psych: euthymic mood, full affect   EKG:   The ekg ordered today demonstrates atrial fibrillation, rate controlled, nonspecific ST segment changes   Recent Labs: 03/28/2016: ALT 24 08/24/2016: BUN 21; Creat 1.27; Potassium 3.9; Sodium 138   Lipid Panel    Component Value Date/Time   CHOL 180 03/28/2016 0728   TRIG 179 (H) 03/28/2016 0728   HDL 37 (L) 03/28/2016 0728   CHOLHDL 4.9 03/28/2016 0728   VLDL 36 (H) 03/28/2016 0728   LDLCALC 107 03/28/2016 0728   LDLDIRECT 86.0 05/13/2015 0739     Other studies Reviewed: Additional studies/ records that were reviewed today with results demonstrating: Digoxin level 0.7.   ASSESSMENT AND PLAN:  1. AFib: Rate controlled. No symptoms. Eliquis for anticoagulation, stroke prevention. No bleeding issues.   2. HTN:  Blood pressure still an acceptable range. I don't think we have to change medications at this time. He will let us know if his systolics tend to run consistently over 150. If so, would likely increase Cardizem to 300 mg daily.  He has been followed in the PharmD HTN clinic as well.  He was told to watch for an angioedema as well.   3. Increased TG: Increased fish oil to 1000 mg BID.  Recheck lipids in about 6 months. I gave him the option of following up with her nurse the office since I would be closer for him. He will consider this option. 4. He will need digoxin level followed at least annually.  In 9/17, acceptable Dig level.     Current medicines are reviewed at length with the patient today.  The patient concerns regarding his medicines were addressed.  The following changes have been made:    Labs/ tests ordered today include:   No orders of  the defined types were placed in this encounter.   Recommend 150 minutes/week of aerobic exercise Low fat, low carb, high fiber diet recommended  Disposition:   FU in one year   Signed, Larae Grooms, MD  09/28/2016 9:37 AM    [facility] [place], [place], [place]  [address] Phone: [phone]; Fax: [phone]

## 2016-10-23 DIAGNOSIS — J069 Acute upper respiratory infection, unspecified: Secondary | ICD-10-CM | POA: Diagnosis not present

## 2016-10-30 DIAGNOSIS — H524 Presbyopia: Secondary | ICD-10-CM | POA: Diagnosis not present

## 2016-10-30 DIAGNOSIS — H353131 Nonexudative age-related macular degeneration, bilateral, early dry stage: Secondary | ICD-10-CM | POA: Diagnosis not present

## 2016-10-30 DIAGNOSIS — H2513 Age-related nuclear cataract, bilateral: Secondary | ICD-10-CM | POA: Diagnosis not present

## 2016-11-25 ENCOUNTER — Other Ambulatory Visit: Payer: Self-pay | Admitting: Family Medicine

## 2017-01-10 ENCOUNTER — Other Ambulatory Visit: Payer: Self-pay | Admitting: Family Medicine

## 2017-01-10 ENCOUNTER — Other Ambulatory Visit: Payer: Self-pay | Admitting: Interventional Cardiology

## 2017-08-24 ENCOUNTER — Other Ambulatory Visit: Payer: Self-pay | Admitting: Interventional Cardiology

## 2017-08-24 NOTE — Telephone Encounter (Signed)
Request received for Eliquis 5mg , pt is 83 yrs old, wt-81.6kg, Crea-1.27 on 08/24/16, last seen by Dr. Acie Fredrickson on 09/28/16 & has an upcoming appt on 10/01/17. Will send in refill request & a note was placed on MD appt & refill sent to Mail Order.

## 2017-08-27 ENCOUNTER — Encounter: Payer: Medicare Other | Admitting: Family Medicine

## 2017-10-01 ENCOUNTER — Ambulatory Visit: Payer: Medicare Other | Admitting: Interventional Cardiology

## 2017-10-25 ENCOUNTER — Encounter: Payer: Self-pay | Admitting: Interventional Cardiology

## 2017-10-25 ENCOUNTER — Ambulatory Visit: Payer: Medicare Other | Admitting: Interventional Cardiology

## 2017-10-25 ENCOUNTER — Other Ambulatory Visit: Payer: Self-pay

## 2017-10-25 ENCOUNTER — Encounter (INDEPENDENT_AMBULATORY_CARE_PROVIDER_SITE_OTHER): Payer: Self-pay

## 2017-10-25 VITALS — BP 150/96 | HR 83 | Ht 67.0 in | Wt 176.4 lb

## 2017-10-25 DIAGNOSIS — I482 Chronic atrial fibrillation, unspecified: Secondary | ICD-10-CM

## 2017-10-25 DIAGNOSIS — M25552 Pain in left hip: Secondary | ICD-10-CM

## 2017-10-25 DIAGNOSIS — M25551 Pain in right hip: Secondary | ICD-10-CM | POA: Diagnosis not present

## 2017-10-25 DIAGNOSIS — E782 Mixed hyperlipidemia: Secondary | ICD-10-CM

## 2017-10-25 DIAGNOSIS — I10 Essential (primary) hypertension: Secondary | ICD-10-CM

## 2017-10-25 DIAGNOSIS — Z7901 Long term (current) use of anticoagulants: Secondary | ICD-10-CM | POA: Diagnosis not present

## 2017-10-25 NOTE — Patient Instructions (Signed)

## 2017-10-25 NOTE — Progress Notes (Signed)
Cardiology Office Note   Date:  10/25/2017   ID:  Nicholas Cruz, DOB [birth year], MRN [id number]  PCP:  Josetta Huddle, MD    No chief complaint on file.  AFib  Wt Readings from Last 3 Encounters:  10/25/17 176 lb 6.4 oz (80 kg)  09/28/16 180 lb (81.6 kg)  08/24/16 181 lb (82.1 kg)       History of Present Illness: Nicholas Cruz is a 83 y.o. male  who has had atrial fibrillation. He was on an antiarrhythmic in the past.    His wife has Alzheimer's disease- although she does not admit it. He is a primary caretaker for her.  THis is a source of stress.   Denies : Chest pain. Dizziness. Leg edema. Nitroglycerin use. Orthopnea. Palpitations. Paroxysmal nocturnal dyspnea. Shortness of breath. Syncope.   He reports hip pain.    Past Medical History:  Diagnosis Date  . Atrial fibrillation ([place]) 2008  . Colorectal polyps 1985  . Duodenal ulcer 1968  . H/O knee surgery 1990  . History of pulmonary embolism 2007    Past Surgical History:  Procedure Laterality Date  . KNEE SURGERY  1990   [facility]     Current Outpatient Medications  Medication Sig Dispense Refill  . DIGOX 125 MCG tablet TAKE 1 TABLET BY MOUTH  DAILY 90 tablet 0  . diltiazem (CARDIZEM CD) 240 MG 24 hr capsule TAKE 1 CAPSULE BY MOUTH  EVERY DAY 90 capsule 0  . ELIQUIS 5 MG TABS tablet TAKE 1 TABLET BY MOUTH TWO  TIMES DAILY 180 tablet 0  . irbesartan-hydrochlorothiazide (AVALIDE) 150-12.5 MG tablet TAKE 1 TABLET BY MOUTH  DAILY 90 tablet 2  . mometasone (NASONEX) 50 MCG/ACT nasal spray Place 2 sprays into the nose daily.    . Omega-3 Fatty Acids (FISH OIL) 1000 MG CAPS Take 1,000 mg by mouth 2 (two) times daily.    . pantoprazole (PROTONIX) 40 MG tablet TAKE 1 TABLET BY MOUTH  DAILY 90 tablet 0  . potassium chloride SA (K-DUR,KLOR-CON) 20 MEQ tablet TAKE 1 TABLET BY MOUTH  DAILY 90 tablet 2  . pravastatin (PRAVACHOL) 80 MG tablet TAKE 1 TABLET BY MOUTH  DAILY 90 tablet 0  . tamsulosin (FLOMAX) 0.4 MG CAPS  capsule TAKE 1 CAPSULE BY MOUTH  DAILY. 90 capsule 1   No current facility-administered medications for this visit.     Allergies:   Pradaxa [dabigatran etexilate mesylate]    Social History:  The patient  reports that  has never smoked. he has never used smokeless tobacco. He reports that he does not drink alcohol or use drugs.   Family History:  The patient's family history includes Cancer - Lung in his brother; Heart attack in his father.    ROS:  Please see the history of present illness.   Otherwise, review of systems are positive for hip pain that limits his walking.   All other systems are reviewed and negative.    PHYSICAL EXAM: VS:  BP (!) 150/96   Pulse 83   Ht 5\' 7"  (1.702 m)   Wt 176 lb 6.4 oz (80 kg)   SpO2 97%   BMI 27.63 kg/m  , BMI Body mass index is 27.63 kg/m. GEN: Well nourished, well developed, in no acute distress  HEENT: normal  Neck: no JVD, carotid bruits, or masses Cardiac: irregularly irregular; no murmurs, rubs, or gallops,no edema ; 2+ right DP pulse Respiratory:  clear to auscultation bilaterally, normal  work of breathing GI: soft, nontender, nondistended, + BS MS: no deformity or atrophy  Skin: warm and dry, no rash Neuro:  Strength and sensation are intact Psych: euthymic mood, full affect   EKG:   The ekg ordered today demonstrates AFib, controlled ventricular rate   Recent Labs: No results found for requested labs within last 8760 hours.   Lipid Panel    Component Value Date/Time   CHOL 180 03/28/2016 0728   TRIG 179 (H) 03/28/2016 0728   HDL 37 (L) 03/28/2016 0728   CHOLHDL 4.9 03/28/2016 0728   VLDL 36 (H) 03/28/2016 0728   LDLCALC 107 03/28/2016 0728   LDLDIRECT 86.0 05/13/2015 0739     Other studies Reviewed: Additional studies/ records that were reviewed today with results demonstrating: labs reviewed, stable renal function..   ASSESSMENT AND PLAN:  1. AFib: Rate controlled.  No sx.  Eliquis for stroke prevention.     2. Hyperlipidemia: LDL 93 in 11/18.   3. HTN: COntrolled at home.  COntinue current meds.  Electrolytes stable.   4. Anticoagulated: Fell once in the yard.  No bleeding problems.  5. Bilateral Hip pain: Likely bursitis.  Could have some vascular disease on the left as his left pedal pulse is difficult to find, but he is not a great candidate for PV angiography.  No tissue loss.  WOuld treat for arthritis pain.     Current medicines are reviewed at length with the patient today.  The patient concerns regarding his medicines were addressed.  The following changes have been made:  No change  Labs/ tests ordered today include:  No orders of the defined types were placed in this encounter.   Recommend 150 minutes/week of aerobic exercise Low fat, low carb, high fiber diet recommended  Disposition:   FU in 1 year   Signed, Larae Grooms, MD  10/25/2017 4:24 PM    [facility] [place], [place], [place]  [address] Phone: [phone]; Fax: [phone]

## 2017-10-31 ENCOUNTER — Other Ambulatory Visit: Payer: Self-pay | Admitting: Interventional Cardiology

## 2017-11-01 NOTE — Telephone Encounter (Addendum)
Eliquis 5mg  refill received; pt is 83 yrs old, wt-80 kg, Crea-1.24 on 10/18/2017 via PCP office, last seen by Dr. Irish Lack on 10/25/17.  Called PCP office & requested labs, labs sent & Creatinine reviewed, will send in refill to requested Pharmacy.

## 2017-12-17 ENCOUNTER — Other Ambulatory Visit: Payer: Self-pay | Admitting: Interventional Cardiology

## 2017-12-17 MED ORDER — DIGOXIN 125 MCG PO TABS: 125.0000 ug | ORAL_TABLET | Freq: Every day | ORAL | 3 refills | Status: DC

## 2017-12-17 MED ORDER — PRAVASTATIN SODIUM 80 MG PO TABS: 80.0000 mg | ORAL_TABLET | Freq: Every day | ORAL | 3 refills | Status: DC

## 2017-12-17 MED ORDER — PANTOPRAZOLE SODIUM 40 MG PO TBEC: 40.0000 mg | DELAYED_RELEASE_TABLET | Freq: Every day | ORAL | 3 refills | Status: DC

## 2017-12-17 MED ORDER — IRBESARTAN-HYDROCHLOROTHIAZIDE 150-12.5 MG PO TABS: 1.0000 | ORAL_TABLET | Freq: Every day | ORAL | 3 refills | Status: DC

## 2017-12-17 MED ORDER — POTASSIUM CHLORIDE CRYS ER 20 MEQ PO TBCR: 20.0000 meq | EXTENDED_RELEASE_TABLET | Freq: Every day | ORAL | 3 refills | Status: DC

## 2017-12-17 MED ORDER — DILTIAZEM HCL ER COATED BEADS 240 MG PO CP24: ORAL_CAPSULE | ORAL | 3 refills | Status: DC

## 2017-12-17 MED ORDER — APIXABAN 5 MG PO TABS: 5.0000 mg | ORAL_TABLET | Freq: Two times a day (BID) | ORAL | 1 refills | Status: DC

## 2017-12-17 NOTE — Telephone Encounter (Signed)
Pt would like his medication Eliquis sent to [facility], pt has new pharmacy. Please resend. Thanks

## 2018-05-14 DIAGNOSIS — M545 Low back pain: Secondary | ICD-10-CM | POA: Diagnosis not present

## 2018-05-28 ENCOUNTER — Ambulatory Visit
Admission: RE | Admit: 2018-05-28 | Discharge: 2018-05-28 | Disposition: A | Discharge status: Home / Self Care | Payer: PPO | Source: Ambulatory Visit | Attending: Internal Medicine | Admitting: Internal Medicine

## 2018-05-28 ENCOUNTER — Other Ambulatory Visit: Payer: Self-pay | Admitting: Internal Medicine

## 2018-05-28 DIAGNOSIS — M545 Low back pain, unspecified: Secondary | ICD-10-CM

## 2018-05-28 DIAGNOSIS — S32010A Wedge compression fracture of first lumbar vertebra, initial encounter for closed fracture: Secondary | ICD-10-CM | POA: Diagnosis not present

## 2018-05-28 DIAGNOSIS — N400 Enlarged prostate without lower urinary tract symptoms: Secondary | ICD-10-CM | POA: Diagnosis not present

## 2018-09-17 ENCOUNTER — Other Ambulatory Visit: Payer: Self-pay | Admitting: Interventional Cardiology

## 2018-09-18 ENCOUNTER — Other Ambulatory Visit: Payer: Self-pay

## 2018-09-18 MED ORDER — APIXABAN 5 MG PO TABS: 5.0000 mg | ORAL_TABLET | Freq: Two times a day (BID) | ORAL | 1 refills | Status: DC

## 2018-09-18 NOTE — Telephone Encounter (Signed)
RX refill sent as requested.  

## 2018-09-18 NOTE — Telephone Encounter (Signed)
Pt requesting refill for eliquis 5 mg BID please address thank you.

## 2018-09-18 NOTE — Addendum Note (Signed)
Addended by: Erskine Emery on: 09/18/2018 09:04 AM   Modules accepted: Orders

## 2018-09-20 DIAGNOSIS — H26493 Other secondary cataract, bilateral: Secondary | ICD-10-CM | POA: Diagnosis not present

## 2018-09-20 DIAGNOSIS — H527 Unspecified disorder of refraction: Secondary | ICD-10-CM | POA: Diagnosis not present

## 2018-09-20 DIAGNOSIS — Z961 Presence of intraocular lens: Secondary | ICD-10-CM | POA: Diagnosis not present

## 2018-09-20 DIAGNOSIS — H26491 Other secondary cataract, right eye: Secondary | ICD-10-CM | POA: Diagnosis not present

## 2018-09-20 DIAGNOSIS — H26492 Other secondary cataract, left eye: Secondary | ICD-10-CM | POA: Diagnosis not present

## 2018-09-20 DIAGNOSIS — H353112 Nonexudative age-related macular degeneration, right eye, intermediate dry stage: Secondary | ICD-10-CM | POA: Diagnosis not present

## 2018-09-20 DIAGNOSIS — H353123 Nonexudative age-related macular degeneration, left eye, advanced atrophic without subfoveal involvement: Secondary | ICD-10-CM | POA: Diagnosis not present

## 2018-10-21 ENCOUNTER — Telehealth: Payer: Self-pay | Admitting: Interventional Cardiology

## 2018-10-21 NOTE — Telephone Encounter (Signed)
Pt c/o medication issue:  1. Name of Medication: digoxin (LANOXIN) 0.125 MG tablet  2. How are you currently taking this medication (dosage and times per day)?   3. Are you having a reaction (difficulty breathing--STAT)?  4. What is your medication issue? Arbie Cookey from [facility] called wanting to know if we can changed the amount dispensed from 60 days to 90 days.  Please call at [phone].  You can leave VM.  Please leave first and last name if you leave a VM.

## 2018-10-21 NOTE — Telephone Encounter (Signed)
Spoke with Nicholas Cruz and made her aware that the patient needs to call and schedule an appointment before an approval for a ninety day supply can be given. Patient should still have medication through 12/17/18 as the previous rx was sent in on 12/17/17. They will reach out to the patient to make him aware and once he schedules an appointment the rx can be updated if necessary.

## 2018-11-21 ENCOUNTER — Ambulatory Visit
Admission: RE | Admit: 2018-11-21 | Discharge: 2018-11-21 | Disposition: A | Discharge status: Home / Self Care | Payer: PPO | Source: Ambulatory Visit | Attending: Internal Medicine | Admitting: Internal Medicine

## 2018-11-21 ENCOUNTER — Other Ambulatory Visit: Payer: Self-pay | Admitting: Internal Medicine

## 2018-11-21 DIAGNOSIS — R0989 Other specified symptoms and signs involving the circulatory and respiratory systems: Principal | ICD-10-CM

## 2018-11-21 DIAGNOSIS — H6123 Impacted cerumen, bilateral: Secondary | ICD-10-CM | POA: Diagnosis not present

## 2018-11-21 DIAGNOSIS — R05 Cough: Secondary | ICD-10-CM | POA: Diagnosis not present

## 2018-11-21 DIAGNOSIS — Z86711 Personal history of pulmonary embolism: Secondary | ICD-10-CM | POA: Diagnosis not present

## 2018-11-21 DIAGNOSIS — Z23 Encounter for immunization: Secondary | ICD-10-CM | POA: Diagnosis not present

## 2018-11-21 DIAGNOSIS — E7800 Pure hypercholesterolemia, unspecified: Secondary | ICD-10-CM | POA: Diagnosis not present

## 2018-11-21 DIAGNOSIS — I2510 Atherosclerotic heart disease of native coronary artery without angina pectoris: Secondary | ICD-10-CM | POA: Diagnosis not present

## 2018-11-21 DIAGNOSIS — I10 Essential (primary) hypertension: Secondary | ICD-10-CM | POA: Diagnosis not present

## 2018-11-21 DIAGNOSIS — E119 Type 2 diabetes mellitus without complications: Secondary | ICD-10-CM | POA: Diagnosis not present

## 2018-11-21 DIAGNOSIS — R0689 Other abnormalities of breathing: Secondary | ICD-10-CM

## 2018-11-21 DIAGNOSIS — E559 Vitamin D deficiency, unspecified: Secondary | ICD-10-CM | POA: Diagnosis not present

## 2018-11-21 DIAGNOSIS — I4891 Unspecified atrial fibrillation: Secondary | ICD-10-CM | POA: Diagnosis not present

## 2018-11-21 DIAGNOSIS — N401 Enlarged prostate with lower urinary tract symptoms: Secondary | ICD-10-CM | POA: Diagnosis not present

## 2018-11-21 DIAGNOSIS — R600 Localized edema: Secondary | ICD-10-CM | POA: Diagnosis not present

## 2018-11-21 DIAGNOSIS — J90 Pleural effusion, not elsewhere classified: Secondary | ICD-10-CM | POA: Diagnosis not present

## 2018-11-21 DIAGNOSIS — Z0001 Encounter for general adult medical examination with abnormal findings: Secondary | ICD-10-CM | POA: Diagnosis not present

## 2018-11-22 ENCOUNTER — Other Ambulatory Visit: Payer: Self-pay | Admitting: Internal Medicine

## 2018-11-22 DIAGNOSIS — J90 Pleural effusion, not elsewhere classified: Secondary | ICD-10-CM

## 2018-11-27 ENCOUNTER — Ambulatory Visit
Admission: RE | Admit: 2018-11-27 | Discharge: 2018-11-27 | Disposition: A | Discharge status: Home / Self Care | Payer: PPO | Source: Ambulatory Visit | Attending: Internal Medicine | Admitting: Internal Medicine

## 2018-11-27 DIAGNOSIS — J90 Pleural effusion, not elsewhere classified: Secondary | ICD-10-CM

## 2018-11-27 MED ORDER — IOPAMIDOL (ISOVUE-300) INJECTION 61%
75.0000 mL | Freq: Once | INTRAVENOUS | Status: AC | PRN
  Administered 2018-11-27: 75 mL via INTRAVENOUS

## 2018-12-09 ENCOUNTER — Other Ambulatory Visit: Payer: Self-pay | Admitting: Internal Medicine

## 2018-12-09 ENCOUNTER — Ambulatory Visit
Admission: RE | Admit: 2018-12-09 | Discharge: 2018-12-09 | Disposition: A | Discharge status: Home / Self Care | Payer: PPO | Source: Ambulatory Visit | Attending: Internal Medicine | Admitting: Internal Medicine

## 2018-12-09 DIAGNOSIS — J181 Lobar pneumonia, unspecified organism: Secondary | ICD-10-CM

## 2018-12-09 DIAGNOSIS — R05 Cough: Secondary | ICD-10-CM | POA: Diagnosis not present

## 2018-12-16 DIAGNOSIS — I4891 Unspecified atrial fibrillation: Secondary | ICD-10-CM | POA: Diagnosis not present

## 2018-12-16 DIAGNOSIS — E119 Type 2 diabetes mellitus without complications: Secondary | ICD-10-CM | POA: Diagnosis not present

## 2018-12-16 DIAGNOSIS — M810 Age-related osteoporosis without current pathological fracture: Secondary | ICD-10-CM | POA: Diagnosis not present

## 2018-12-16 DIAGNOSIS — I10 Essential (primary) hypertension: Secondary | ICD-10-CM | POA: Diagnosis not present

## 2018-12-16 DIAGNOSIS — N401 Enlarged prostate with lower urinary tract symptoms: Secondary | ICD-10-CM | POA: Diagnosis not present

## 2018-12-16 DIAGNOSIS — N400 Enlarged prostate without lower urinary tract symptoms: Secondary | ICD-10-CM | POA: Diagnosis not present

## 2018-12-16 DIAGNOSIS — I2510 Atherosclerotic heart disease of native coronary artery without angina pectoris: Secondary | ICD-10-CM | POA: Diagnosis not present

## 2018-12-17 ENCOUNTER — Encounter

## 2018-12-26 ENCOUNTER — Encounter: Payer: Self-pay | Admitting: Pulmonary Disease

## 2018-12-26 ENCOUNTER — Ambulatory Visit (INDEPENDENT_AMBULATORY_CARE_PROVIDER_SITE_OTHER): Payer: Medicare Other | Admitting: Pulmonary Disease

## 2018-12-26 VITALS — BP 162/72 | HR 99 | Ht 67.0 in | Wt 171.2 lb

## 2018-12-26 DIAGNOSIS — J90 Pleural effusion, not elsewhere classified: Secondary | ICD-10-CM

## 2018-12-26 DIAGNOSIS — M79602 Pain in left arm: Secondary | ICD-10-CM

## 2018-12-26 DIAGNOSIS — R0602 Shortness of breath: Secondary | ICD-10-CM | POA: Diagnosis not present

## 2018-12-26 LAB — EKG 12-LEAD

## 2018-12-26 NOTE — Progress Notes (Signed)
Synopsis: Referred in 12/2018 for right pleural effusion  Subjective:   PATIENT ID: Nicholas Cruz GENDER: male DOB: 01/13/1931, MRN: [id number]   HPI  Chief Complaint  Patient presents with  . Consult    went for regular exam with PCP - CXR revealed fluid on lung - pt had been feeling some SOB   85 year old male with atrial fibrillation on anticoaguation, OSA, HTN, HLD, GERN and hx unprovoked PE in 2007 who presents as new consult pleural effusion.  He reports three month history of gradually worsening shortness of breath that worsens with exertion. Associated with cough. Improves with rest. Denies associated fevers, chills, chest pain. However does complain of left arm tingling that runs up his arm at rest in the last three weeks.   Reviewed PCP records from [facility]: CT Chest 11/27/18 obtained for chronic cough for new right pleural effusion seen on 11/21/18 CXR.  Brother cancer passed from lung CA  Social History: 2ppd x 12 years. Quit 40y.   Environmental exposures: No known exposures.  I have personally reviewed patient's past medical/family/social history, allergies, current medications.  Past Medical History:  Diagnosis Date  . Atrial fibrillation ([place]) 2008  . Colorectal polyps 1985  . Duodenal ulcer 1968  . H/O knee surgery 1990  . History of pulmonary embolism 2007  . Hyperlipidemia   . Hypertension      Family History  Problem Relation Age of Onset  . Heart attack Father   . Cancer - Lung Brother      Social History   Occupational History  . Occupation: retired  Tobacco Use  . Smoking status: Former Smoker    Packs/day: 2.00    Years: 12.00    Pack years: 24.00    Types: Cigarettes    Last attempt to quit: 12/18/1978    Years since quitting: 40.0  . Smokeless tobacco: Never Used  Substance and Sexual Activity  . Alcohol use: No  . Drug use: No  . Sexual activity: Not Currently    Partners: Female    Allergies  Allergen Reactions  . Pradaxa  [Dabigatran Etexilate Mesylate] Other (See Comments)    Hemoptysis     Outpatient Medications Prior to Visit  Medication Sig Dispense Refill  . apixaban (ELIQUIS) 5 MG TABS tablet Take 1 tablet (5 mg total) by mouth 2 (two) times daily. 180 tablet 1  . digoxin (LANOXIN) 0.125 MG tablet Take 1 tablet by mouth every day 60 tablet 0  . diltiazem (CARTIA XT) 240 MG 24 hr capsule Take 1 capsule by mouth every day 60 capsule 0  . irbesartan-hydrochlorothiazide (AVALIDE) 150-12.5 MG tablet Take 1 tablet by mouth every day 60 tablet 0  . Omega-3 Fatty Acids (FISH OIL) 1000 MG CAPS Take 1,000 mg by mouth 2 (two) times daily.    . pantoprazole (PROTONIX) 40 MG tablet Take 1 tablet by mouth every day 60 tablet 0  . potassium chloride SA (K-DUR,KLOR-CON) 20 MEQ tablet Take 1 tablet by mouth every day 60 tablet 0  . pravastatin (PRAVACHOL) 80 MG tablet Take 1 tablet by mouth every day 60 tablet 0  . tamsulosin (FLOMAX) 0.4 MG CAPS capsule TAKE 1 CAPSULE BY MOUTH  DAILY. 90 capsule 1  . mometasone (NASONEX) 50 MCG/ACT nasal spray Place 2 sprays into the nose daily.     No facility-administered medications prior to visit.     Review of Systems  Constitutional: Negative for chills, diaphoresis, fever, malaise/fatigue and weight loss.  HENT: Negative for congestion and sore throat.   Respiratory: Positive for cough and shortness of breath. Negative for hemoptysis, sputum production and wheezing.   Cardiovascular: Negative for chest pain, orthopnea, leg swelling and PND.  Gastrointestinal: Negative for abdominal pain, heartburn and nausea.  Genitourinary: Negative for frequency.  Musculoskeletal: Negative for myalgias.  Skin: Negative for rash.  Neurological: Negative for dizziness, weakness and headaches.  Endo/Heme/Allergies: Does not bruise/bleed easily.     Objective:   Vitals:   12/26/18 1107  BP: (!) 162/72  Pulse: 99  SpO2: 96%  Weight: 171 lb 3.2 oz (77.7 kg)  Height: 5\' 7"  (1.702 m)     SpO2: 96 % O2 Device: None (Room air)  Physical Exam General: Well-appearing, no acute distress HENT: [place], AT, OP clear, MMM Eyes: EOMI, no scleral icterus Respiratory: Decreased right basilar breath sounds. No crackles, wheezing or rales Cardiovascular: RRR, -M/R/G, no JVD GI: BS+, soft, nontender Extremities:-Edema,-tenderness Neuro: AAO x4, CNII-XII grossly intact Skin: Intact, no rashes or bruising Psych: Normal mood, normal affect  Chest imaging: CT Chest 11-27-18 (report only): Large right pleural effusion with right lower lobe consolidation. Stable right lung parenchymal nodules from 2007 consistent with a benign etiology  PFT: None on file  EKG 12/26/18 - Atrial fibrillation  Imaging, labs and test noted above have been reviewed independently by me.    Assessment & Plan:   Right pleural effusion  Discussion: 85 year old male with atrial fibrillation on eliquis who presents with cough and found with right-sided pleural effusion. EKG in-office completed and demonstrated atrial fibrillation. Discussed need to hold anticoagulation prior to procedure and patient will need to discuss with IR timing to hold his eliquis.  Right Pleural Effusion  --We will order a procedure called a THORACENTESIS to arrange for the fluid in your lung to be drained --BEFORE the procedure, you will need labs drawn to check your blood levels (CBC, LDH and protein) --We will also order an ultrasound of your heart called an ECHOCARDIOGRAM to evaluate your heart function as this may be related to the fluid in your lung  Orders Placed This Encounter  Procedures  . IR THORACENTESIS ASP PLEURAL SPACE W/IMG GUIDE    DAY OF PROCEDURE:  Patient will need the following labs drawn BEFORE the thoracentesis:  Serum LDH, serum Protein and CBC    Standing Status:   Future    Standing Expiration Date:   02/24/2020    Order Specific Question:   Are labs required for specimen collection?    Answer:   Yes     Order Specific Question:   Lab orders requested (DO NOT place separate lab orders, these will be automatically ordered during procedure specimen collection):    Answer:   Amylase, Body Fluid    Order Specific Question:   Lab orders requested (DO NOT place separate lab orders, these will be automatically ordered during procedure specimen collection):    Answer:   Albumin, Body Fluid    Order Specific Question:   Lab orders requested (DO NOT place separate lab orders, these will be automatically ordered during procedure specimen collection):    Answer:   Glucose, Body Fluid    Order Specific Question:   Lab orders requested (DO NOT place separate lab orders, these will be automatically ordered during procedure specimen collection):    Answer:   Protein, Body Fluid    Order Specific Question:   Lab orders requested (DO NOT place separate lab orders, these will be  automatically ordered during procedure specimen collection):    Answer:   Cholesterol, Body Fluid    Order Specific Question:   Lab orders requested (DO NOT place separate lab orders, these will be automatically ordered during procedure specimen collection):    Answer:   Lactate Dehydrogenase, Body Fluid    Order Specific Question:   Lab orders requested (DO NOT place separate lab orders, these will be automatically ordered during procedure specimen collection):    Answer:   Body Fluid Cell Count With Differential    Order Specific Question:   Lab orders requested (DO NOT place separate lab orders, these will be automatically ordered during procedure specimen collection):    Answer:   Cytology - Non Pap    Order Specific Question:   Lab orders requested (DO NOT place separate lab orders, these will be automatically ordered during procedure specimen collection):    Answer:   Body Fluid Culture    Order Specific Question:   Lab orders requested (DO NOT place separate lab orders, these will be automatically ordered during procedure specimen  collection):    Answer:   Gram Stain    Order Specific Question:   Lab orders requested (DO NOT place separate lab orders, these will be automatically ordered during procedure specimen collection):    Answer:   Ph, Body Fluid    Order Specific Question:   Lab orders requested (DO NOT place separate lab orders, these will be automatically ordered during procedure specimen collection):    Answer:   Triglycerides, Body Fluids    Order Specific Question:   Reason for Exam (SYMPTOM  OR DIAGNOSIS REQUIRED)    Answer:   pleural effusion    Order Specific Question:   Preferred Imaging Location?    Answer:   [facility]  . CBC with Differential/Platelet    Collect day of but prior to thoracentesis    Standing Status:   Future    Standing Expiration Date:   12/26/2019  . Lactate Dehydrogenase (LDH)    Collect day of and prior to thoracentesis    Standing Status:   Future    Standing Expiration Date:   12/26/2019  . Serum protein electrophoresis with reflex    Collect day of but prior to thoracentesis    Standing Status:   Future    Standing Expiration Date:   12/26/2019  . EKG 12-Lead  . ECHOCARDIOGRAM COMPLETE BUBBLE STUDY    Standing Status:   Future    Standing Expiration Date:   03/26/2020    Order Specific Question:   Where should this test be performed    Answer:   [facility])    Order Specific Question:   Does the patient weigh less than or greater than 250 lbs?    Answer:   Patient weighs less than 250 lbs    Order Specific Question:   Perflutren DEFINITY (image enhancing agent) should be administered unless hypersensitivity or allergy exist    Answer:   Administer Perflutren    Order Specific Question:   Reason for exam    Answer:   Other-Full Diagnosis List    Order Specific Question:   Full ICD-10/Reason for Exam    Answer:   Shortness of breath [786.05.ICD-9-CM]    Order Specific Question:   Full ICD-10/Reason for Exam    Answer:   Pleural effusion  [242230]  No orders of the defined types were placed in this encounter.   Return in about  1 month (around 01/26/2019).  Chi Rodman Pickle, MD [facility] 12/29/2018 1:44 PM  Personal pager: [phone] If unanswered, please page CCM On-call: [phone]

## 2018-12-26 NOTE — Patient Instructions (Signed)
Right Pleural Effusion  --We will order a procedure called a THORACENTESIS to arrange for the fluid in your lung to be drained --BEFORE the procedure, you will need labs drawn to check your blood levels (CBC, LDH and protein) --We will also order an ultrasound of your heart called an ECHOCARDIOGRAM to evaluate your heart function as this may be related to the fluid in your lung

## 2018-12-29 ENCOUNTER — Encounter: Payer: Self-pay | Admitting: Pulmonary Disease

## 2018-12-29 DIAGNOSIS — I898 Other specified noninfective disorders of lymphatic vessels and lymph nodes: Secondary | ICD-10-CM | POA: Insufficient documentation

## 2018-12-29 DIAGNOSIS — J90 Pleural effusion, not elsewhere classified: Secondary | ICD-10-CM | POA: Insufficient documentation

## 2018-12-29 DIAGNOSIS — J940 Chylous effusion: Secondary | ICD-10-CM | POA: Insufficient documentation

## 2018-12-30 ENCOUNTER — Encounter (HOSPITAL_COMMUNITY): Payer: Self-pay | Admitting: Radiology

## 2018-12-30 ENCOUNTER — Ambulatory Visit (HOSPITAL_COMMUNITY)
Admission: RE | Admit: 2018-12-30 | Discharge: 2018-12-30 | Disposition: A | Discharge status: Home / Self Care | Payer: Medicare Other | Source: Ambulatory Visit | Attending: Pulmonary Disease | Admitting: Pulmonary Disease

## 2018-12-30 ENCOUNTER — Ambulatory Visit (HOSPITAL_COMMUNITY)
Admission: RE | Admit: 2018-12-30 | Discharge: 2018-12-30 | Disposition: A | Discharge status: Home / Self Care | Payer: Medicare Other | Source: Ambulatory Visit | Attending: Radiology | Admitting: Radiology

## 2018-12-30 ENCOUNTER — Other Ambulatory Visit (HOSPITAL_COMMUNITY): Payer: Self-pay | Admitting: Radiology

## 2018-12-30 DIAGNOSIS — J90 Pleural effusion, not elsewhere classified: Secondary | ICD-10-CM

## 2018-12-30 DIAGNOSIS — R0602 Shortness of breath: Secondary | ICD-10-CM | POA: Diagnosis present

## 2018-12-30 HISTORY — PX: IR THORACENTESIS ASP PLEURAL SPACE W/IMG GUIDE: IMG5380

## 2018-12-30 LAB — CBC WITH DIFFERENTIAL/PLATELET
Abs Immature Granulocytes: 0.04 10*3/uL (ref 0.00–0.07)
Basophils Absolute: 0.1 10*3/uL (ref 0.0–0.1)
Basophils Relative: 1 %
Eosinophils Absolute: 0.1 10*3/uL (ref 0.0–0.5)
Eosinophils Relative: 1 %
HCT: 45.8 % (ref 39.0–52.0)
Hemoglobin: 15.1 g/dL (ref 13.0–17.0)
Immature Granulocytes: 0 %
Lymphocytes Relative: 17 %
Lymphs Abs: 2 10*3/uL (ref 0.7–4.0)
MCH: 29.7 pg (ref 26.0–34.0)
MCHC: 33 g/dL (ref 30.0–36.0)
MCV: 90 fL (ref 80.0–100.0)
Monocytes Absolute: 1 10*3/uL (ref 0.1–1.0)
Monocytes Relative: 9 %
Neutro Abs: 8.2 10*3/uL — ABNORMAL HIGH (ref 1.7–7.7)
Neutrophils Relative %: 72 %
Platelets: 210 10*3/uL (ref 150–400)
RBC: 5.09 MIL/uL (ref 4.22–5.81)
RDW: 14.1 % (ref 11.5–15.5)
WBC: 11.3 10*3/uL — ABNORMAL HIGH (ref 4.0–10.5)
nRBC: 0 % (ref 0.0–0.2)

## 2018-12-30 LAB — PROTEIN, PLEURAL OR PERITONEAL FLUID: Total protein, fluid: UNDETERMINED g/dL

## 2018-12-30 LAB — BODY FLUID CELL COUNT WITH DIFFERENTIAL
Lymphs, Fluid: 86 %
Monocyte-Macrophage-Serous Fluid: 12 % — ABNORMAL LOW (ref 50–90)
Neutrophil Count, Fluid: 2 % (ref 0–25)
Total Nucleated Cell Count, Fluid: 815 cu mm (ref 0–1000)

## 2018-12-30 LAB — GRAM STAIN

## 2018-12-30 LAB — GLUCOSE, PLEURAL OR PERITONEAL FLUID: Glucose, Fluid: 143 mg/dL

## 2018-12-30 LAB — LACTATE DEHYDROGENASE, PLEURAL OR PERITONEAL FLUID: LD, Fluid: 73 U/L — ABNORMAL HIGH (ref 3–23)

## 2018-12-30 LAB — AMYLASE, PLEURAL OR PERITONEAL FLUID: Amylase, Fluid: 46 U/L

## 2018-12-30 LAB — ALBUMIN, PLEURAL OR PERITONEAL FLUID: Albumin, Fluid: 3.1 g/dL

## 2018-12-30 LAB — LACTATE DEHYDROGENASE: LDH: 155 U/L (ref 98–192)

## 2018-12-30 MED ORDER — LIDOCAINE HCL 1 % IJ SOLN
INTRAMUSCULAR | Status: AC
  Filled 2018-12-30: qty 20

## 2018-12-30 MED ORDER — LIDOCAINE HCL 1 % IJ SOLN
INTRAMUSCULAR | Status: DC | PRN
  Administered 2018-12-30: 10 mL

## 2018-12-31 ENCOUNTER — Telehealth: Payer: Self-pay | Admitting: Pulmonary Disease

## 2018-12-31 LAB — TRIGLYCERIDES, BODY FLUIDS: Triglycerides, Fluid: 1920 mg/dL

## 2018-12-31 NOTE — Telephone Encounter (Signed)
Please contact patient regarding following results:  Pleural studies consistent with chylothorax. This means there is an abnormality in how your lymph nodes drain. We will need to additional lab tests and CT imaging to determine the cause of this as you are likely to re-develop the same symptoms again. You are scheduled to see my in February however if you have symptoms of worsening shortness of breath or cough similar to your prior presentation, please contact our office so we can repeat another CXR if needed.  Rodman Pickle, MD

## 2018-12-31 NOTE — Progress Notes (Signed)
Please contact patient regarding following results:  Pleural studies consistent with chylothorax. This means there is an abnormality in how your lymph nodes drain. We will need to additional lab tests and CT imaging to determine the cause of this as you are likely to re-develop the same symptoms again. You are scheduled to see my in February however if you have symptoms of worsening shortness of breath or cough similar to your prior presentation, please contact our office so we can repeat another CXR if needed.  Rodman Pickle, MD

## 2019-01-01 LAB — PROTEIN ELECTROPHORESIS, SERUM
A/G Ratio: 1.2 (ref 0.7–1.7)
Albumin ELP: 3.8 g/dL (ref 2.9–4.4)
Alpha-1-Globulin: 0.2 g/dL (ref 0.0–0.4)
Alpha-2-Globulin: 1 g/dL (ref 0.4–1.0)
Beta Globulin: 1.3 g/dL (ref 0.7–1.3)
Gamma Globulin: 0.8 g/dL (ref 0.4–1.8)
Globulin, Total: 3.3 g/dL (ref 2.2–3.9)
Total Protein ELP: 7.1 g/dL (ref 6.0–8.5)

## 2019-01-01 LAB — PH, BODY FLUID: pH, Body Fluid: 7.9

## 2019-01-01 NOTE — Progress Notes (Signed)
Spoke with the pt and notified of results per Dr Loanne Drilling. He verbalized understanding. Will keep planned ov with Dr Loanne Drilling for 01/24/19 and he will call sooner if needed.

## 2019-01-01 NOTE — Telephone Encounter (Signed)
See lab result note Pt aware of results

## 2019-01-02 ENCOUNTER — Ambulatory Visit (HOSPITAL_COMMUNITY): Payer: Medicare Other | Attending: Cardiovascular Disease

## 2019-01-02 ENCOUNTER — Other Ambulatory Visit: Payer: Self-pay

## 2019-01-02 DIAGNOSIS — R0602 Shortness of breath: Secondary | ICD-10-CM | POA: Insufficient documentation

## 2019-01-02 DIAGNOSIS — J90 Pleural effusion, not elsewhere classified: Secondary | ICD-10-CM | POA: Diagnosis present

## 2019-01-03 LAB — CHOLESTEROL, BODY FLUID: Cholesterol, Fluid: 82 mg/dL

## 2019-01-04 LAB — CULTURE, BODY FLUID W GRAM STAIN -BOTTLE: Culture: NO GROWTH

## 2019-01-06 ENCOUNTER — Telehealth: Payer: Self-pay | Admitting: Interventional Cardiology

## 2019-01-06 ENCOUNTER — Telehealth: Payer: Self-pay | Admitting: Pulmonary Disease

## 2019-01-06 NOTE — Telephone Encounter (Signed)
Contacted by phone and given results of Echocardiogram per Dr. Elzie Rings:  Demonstrates severe left atrial enlargement that may be related to his known atrial fibrillation. Recommend scheduling follow-up with his Cardiologist.  Patient acknowledged understanding and will call cardiologist for follow up.  Nothing further needed.

## 2019-01-06 NOTE — Telephone Encounter (Signed)
New message     Pt calling in to make Dr.Varanasi aware of Echo test that was done with Dr. Loanne Drilling and pt and heart is enlarged.pt wants  follow up call  to let him you are aware.

## 2019-01-06 NOTE — Telephone Encounter (Signed)
Spoke with pt and made him aware that we can see the echo that Dr. Loanne Drilling ordered.  Dr. Loanne Drilling recommended that he f/u with Cardiology.  Scheduled pt to see Dr. Irish Lack on 1/31.  Advised I will route to Dr. Irish Lack and if he has any recommendations prior to his appt we would call back, otherwise we will see him next week.  Pt appreciative for call.

## 2019-01-08 NOTE — Telephone Encounter (Signed)
Dilated atria. LV function intact.  No specific management at this time other than management of AFib.

## 2019-01-08 NOTE — Telephone Encounter (Signed)
Patient made aware.

## 2019-01-09 NOTE — Progress Notes (Signed)
Cardiology Office Note   Date:  01/17/2019   ID:  CORDERIUS SARACENI, DOB [birth year], MRN [id number]  PCP:  Josetta Huddle, MD    No chief complaint on file.  AFib  Wt Readings from Last 3 Encounters:  01/17/19 171 lb 6.4 oz (77.7 kg)  12/26/18 171 lb 3.2 oz (77.7 kg)  10/25/17 176 lb 6.4 oz (80 kg)       History of Present Illness: Nicholas Cruz is a 85 y.o. male  who has had atrial fibrillation. He was on an antiarrhythmic in the past. Now Rate controlled.   Eliquis for stroke prevention.    His wife has Alzheimer's disease- although she does not admit it. He is a primary caretaker for her.  THis is a source of stress.   He had an echo in Jan 2020 at Dr. Cordelia Pen office showing dilated atria and was referred back for evaluation.  LV function was normal.   He had some fluid removed from his right lung from VIR.  He feels much better after this procedure.  Denies : Chest pain. Dizziness. Leg edema. Nitroglycerin use. Orthopnea. Palpitations. Paroxysmal nocturnal dyspnea. Shortness of breath. Syncope.       Past Medical History:  Diagnosis Date  . Atrial fibrillation ([place]) 2008  . Colorectal polyps 1985  . Duodenal ulcer 1968  . H/O knee surgery 1990  . History of pulmonary embolism 2007  . Hyperlipidemia   . Hypertension     Past Surgical History:  Procedure Laterality Date  . IR THORACENTESIS ASP PLEURAL SPACE W/IMG GUIDE  12/30/2018  . KNEE SURGERY  1990   [facility]     Current Outpatient Medications  Medication Sig Dispense Refill  . apixaban (ELIQUIS) 5 MG TABS tablet Take 1 tablet (5 mg total) by mouth 2 (two) times daily. 180 tablet 1  . digoxin (LANOXIN) 0.125 MG tablet Take 1 tablet by mouth every day 60 tablet 0  . irbesartan-hydrochlorothiazide (AVALIDE) 150-12.5 MG tablet Take 1 tablet by mouth every day 60 tablet 0  . mometasone (NASONEX) 50 MCG/ACT nasal spray Place 2 sprays into the nose daily.    . Omega-3 Fatty Acids (FISH OIL) 1000 MG CAPS Take  1,000 mg by mouth 2 (two) times daily.    . pantoprazole (PROTONIX) 40 MG tablet Take 1 tablet by mouth every day 60 tablet 0  . potassium chloride SA (K-DUR,KLOR-CON) 20 MEQ tablet Take 1 tablet by mouth every day 60 tablet 0  . pravastatin (PRAVACHOL) 80 MG tablet Take 1 tablet by mouth every day 60 tablet 0  . tamsulosin (FLOMAX) 0.4 MG CAPS capsule TAKE 1 CAPSULE BY MOUTH  DAILY. 90 capsule 1  . diltiazem (CARDIZEM CD) 300 MG 24 hr capsule Take 1 capsule (300 mg total) by mouth daily. 90 capsule 3   No current facility-administered medications for this visit.     Allergies:   Pradaxa [dabigatran etexilate mesylate]    Social History:  The patient  reports that he quit smoking about 40 years ago. His smoking use included cigarettes. He has a 24.00 pack-year smoking history. He has never used smokeless tobacco. He reports that he does not drink alcohol or use drugs.   Family History:  The patient's family history includes Cancer - Lung in his brother; Heart attack in his father.    ROS:  Please see the history of present illness.   Otherwise, review of systems are positive for recent shortness of breath- improved.  All other systems are reviewed and negative.    PHYSICAL EXAM: VS:  BP (!) 156/80   Pulse 86   Ht 5\' 7"  (1.702 m)   Wt 171 lb 6.4 oz (77.7 kg)   SpO2 95%   BMI 26.85 kg/m  , BMI Body mass index is 26.85 kg/m. GEN: Well nourished, well developed, in no acute distress  HEENT: normal  Neck: no JVD, carotid bruits, or masses Cardiac: tachycardic, irregularly irregular; no murmurs, rubs, or gallops,no edema  Respiratory:  clear to auscultation bilaterally, normal work of breathing GI: soft, nontender, nondistended, + BS MS: no deformity or atrophy  Skin: warm and dry, no rash Neuro:  Strength and sensation are intact Psych: euthymic mood, full affect   EKG:    ECG from December 26, 2018 shows A. fib with ventricular rate of 98  Recent Labs: 12/30/2018: Hemoglobin  15.1; Platelets 210   Lipid Panel    Component Value Date/Time   CHOL 180 03/28/2016 0728   TRIG 179 (H) 03/28/2016 0728   HDL 37 (L) 03/28/2016 0728   CHOLHDL 4.9 03/28/2016 0728   VLDL 36 (H) 03/28/2016 0728   LDLCALC 107 03/28/2016 0728   LDLDIRECT 86.0 05/13/2015 0739     Other studies Reviewed: Additional studies/ records that were reviewed today with results demonstrating: Prior records reviewed.  Hemoglobin and creatinine were last checked in January 2020 and December 2019 respectively.   ASSESSMENT AND PLAN:  1. AFib: Heart rate by my check during auscultation was around 120 bpm.  Increase Diltiazem to 300 mg daily.  THis should help with BP and rate control.  Higher heart rate could lead to more diastolic dysfunction which could lead to more pleural effusions.  He should benefit from a slower heart rate. 2. Anticoagulated: Eliquis for stroke prevention. He remains asymptomatic even with the higher heart rate. 3. Hypertension: Elevated today.  Should improve with additional diltiazem. 4. Hyperlipidemia: LDL 96 in December 2019.  Continue pravastatin. 5. Dilated atria is related to his atrial fibrillation.  Had normal LV function on the January 2020 echocardiogram.   Current medicines are reviewed at length with the patient today.  The patient concerns regarding his medicines were addressed.  The following changes have been made: Increase diltiazem  Labs/ tests ordered today include:  No orders of the defined types were placed in this encounter.   Recommend 150 minutes/week of aerobic exercise Low fat, low carb, high fiber diet recommended  Disposition:   FU in 3 months   Signed, Larae Grooms, MD  01/17/2019 11:38 AM    [facility] [address], [place], [place]  [address] Phone: [phone]; Fax: [phone]

## 2019-01-17 ENCOUNTER — Encounter: Payer: Self-pay | Admitting: Interventional Cardiology

## 2019-01-17 ENCOUNTER — Ambulatory Visit: Payer: Medicare Other | Admitting: Interventional Cardiology

## 2019-01-17 VITALS — BP 156/80 | HR 86 | Ht 67.0 in | Wt 171.4 lb

## 2019-01-17 DIAGNOSIS — I482 Chronic atrial fibrillation, unspecified: Secondary | ICD-10-CM | POA: Diagnosis not present

## 2019-01-17 DIAGNOSIS — I10 Essential (primary) hypertension: Secondary | ICD-10-CM

## 2019-01-17 DIAGNOSIS — E782 Mixed hyperlipidemia: Secondary | ICD-10-CM | POA: Diagnosis not present

## 2019-01-17 DIAGNOSIS — Z7901 Long term (current) use of anticoagulants: Secondary | ICD-10-CM | POA: Diagnosis not present

## 2019-01-17 MED ORDER — DILTIAZEM HCL ER COATED BEADS 300 MG PO CP24: 300.0000 mg | ORAL_CAPSULE | Freq: Every day | ORAL | 3 refills | Status: DC

## 2019-01-17 NOTE — Patient Instructions (Signed)
Medication Instructions:  Your physician has recommended you make the following change in your medication:   INCREASE: diltiazem to 300 mg once a day  If you need a refill on your cardiac medications before your next appointment, please call your pharmacy.   Lab work: None Ordered  If you have labs (blood work) drawn today and your tests are completely normal, you will receive your results only by: Marland Kitchen MyChart Message (if you have MyChart) OR . A paper copy in the mail If you have any lab test that is abnormal or we need to change your treatment, we will call you to review the results.  Testing/Procedures: None ordered  Follow-Up: . Your physician recommends that you schedule a follow-up appointment with an EKG in: 3 months with Dr. Irish Lack   Any Other Special Instructions Will Be Listed Below (If Applicable).

## 2019-01-24 ENCOUNTER — Ambulatory Visit: Payer: Medicare Other | Admitting: Pulmonary Disease

## 2019-02-06 ENCOUNTER — Other Ambulatory Visit: Payer: Self-pay | Admitting: Interventional Cardiology

## 2019-02-06 ENCOUNTER — Other Ambulatory Visit: Payer: Self-pay

## 2019-02-06 MED ORDER — DIGOXIN 125 MCG PO TABS: 125.0000 ug | ORAL_TABLET | Freq: Every day | ORAL | 3 refills | Status: DC

## 2019-02-06 MED ORDER — APIXABAN 5 MG PO TABS: 5.0000 mg | ORAL_TABLET | Freq: Two times a day (BID) | ORAL | 2 refills | Status: DC

## 2019-02-06 NOTE — Telephone Encounter (Signed)
Eliquis 5mg  reill request received; pt is 85 yrs old, wt-77.7kg, Crea-1.10 via [facility] on 11/21/2018, last seen by Dr. Irish Lack on 1/3/220; will send in refill to requested pharmcy.

## 2019-02-06 NOTE — Telephone Encounter (Signed)
° ° ° °*  STAT* If patient is at the pharmacy, call can be transferred to refill team.   1. Which medications need to be refilled? (please list name of each medication and dose if known) apixaban (ELIQUIS) 5 MG TABS tablet, digoxin (LANOXIN) 0.125 MG tablet  2. Which pharmacy/location (including street and city if local pharmacy) is medication to be sent to? [place], [place]  3. Do they need a 30 day or 90 day supply? [place]

## 2019-02-10 ENCOUNTER — Encounter: Payer: Self-pay | Admitting: Pulmonary Disease

## 2019-02-10 ENCOUNTER — Ambulatory Visit: Payer: Medicare Other | Admitting: Pulmonary Disease

## 2019-02-10 ENCOUNTER — Ambulatory Visit (INDEPENDENT_AMBULATORY_CARE_PROVIDER_SITE_OTHER)
Admission: RE | Admit: 2019-02-10 | Discharge: 2019-02-10 | Disposition: A | Discharge status: Home / Self Care | Payer: Medicare Other | Source: Ambulatory Visit | Attending: Obstetrics and Gynecology | Admitting: Obstetrics and Gynecology

## 2019-02-10 VITALS — BP 130/78 | HR 102 | Ht 67.0 in | Wt 168.4 lb

## 2019-02-10 DIAGNOSIS — J90 Pleural effusion, not elsewhere classified: Secondary | ICD-10-CM | POA: Diagnosis not present

## 2019-02-10 DIAGNOSIS — J940 Chylous effusion: Secondary | ICD-10-CM

## 2019-02-10 DIAGNOSIS — I898 Other specified noninfective disorders of lymphatic vessels and lymph nodes: Secondary | ICD-10-CM

## 2019-02-10 LAB — COMPREHENSIVE METABOLIC PANEL
ALT: 10 U/L (ref 0–53)
AST: 16 U/L (ref 0–37)
Albumin: 4.1 g/dL (ref 3.5–5.2)
Alkaline Phosphatase: 79 U/L (ref 39–117)
BUN: 22 mg/dL (ref 6–23)
CO2: 30 mEq/L (ref 19–32)
Calcium: 9.5 mg/dL (ref 8.4–10.5)
Chloride: 99 mEq/L (ref 96–112)
Creatinine, Ser: 1.24 mg/dL (ref 0.40–1.50)
GFR: 55.31 mL/min — ABNORMAL LOW (ref >60.00)
Glucose, Bld: 132 mg/dL — ABNORMAL HIGH (ref 70–99)
Potassium: 4.7 mEq/L (ref 3.5–5.1)
Sodium: 137 mEq/L (ref 135–145)
Total Bilirubin: 0.6 mg/dL (ref 0.2–1.2)
Total Protein: 7.5 g/dL (ref 6.0–8.3)

## 2019-02-10 LAB — CBC WITH DIFFERENTIAL/PLATELET
Basophils Absolute: 0.1 10*3/uL (ref 0.0–0.1)
Basophils Relative: 0.9 % (ref 0.0–3.0)
Eosinophils Absolute: 0.1 10*3/uL (ref 0.0–0.7)
Eosinophils Relative: 0.6 % (ref 0.0–5.0)
HCT: 44.5 % (ref 39.0–52.0)
Hemoglobin: 14.9 g/dL (ref 13.0–17.0)
Lymphocytes Relative: 13 % (ref 12.0–46.0)
Lymphs Abs: 1.3 10*3/uL (ref 0.7–4.0)
MCHC: 33.4 g/dL (ref 30.0–36.0)
MCV: 89.2 fl (ref 78.0–100.0)
Monocytes Absolute: 0.8 10*3/uL (ref 0.1–1.0)
Monocytes Relative: 7.8 % (ref 3.0–12.0)
Neutro Abs: 7.8 10*3/uL — ABNORMAL HIGH (ref 1.4–7.7)
Neutrophils Relative %: 77.7 % — ABNORMAL HIGH (ref 43.0–77.0)
Platelets: 219 10*3/uL (ref 150.0–400.0)
RBC: 4.99 Mil/uL (ref 4.22–5.81)
RDW: 14.9 % (ref 11.5–15.5)
WBC: 10 10*3/uL (ref 4.0–10.5)

## 2019-02-10 NOTE — Patient Instructions (Signed)
Right Pleural Chylothorax CXR ordered and reviewed in clinic independently by me.  CT Chest/Abdomen and Pelvis without contrast ordered to evaluate  Labs: CBC with diff, eosinophils, complete metabolic panel

## 2019-02-10 NOTE — Progress Notes (Signed)
Synopsis: Referred in 01/2019 for pleural effusion  Subjective:   PATIENT ID: Nicholas Cruz GENDER: male DOB: [birth year], MRN: [id number]   HPI  Chief Complaint  Patient presents with  . Follow-up    PE: Pt states he continues to have SOB since last visit.    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  He was initially seen in clinic with me on 12/26/2018 for right pleural effusion seen on chest imaging since 11/21/18. Underwent thoracentesis on 1/13 with 1.3L of milky fluid drained. Fluid demonstrated predominantly lymphocytic with trig 1920, chol 82, glu 143, LDH 73. Cultures and cytology neg. He initially felt relief post-procedure however felt his dyspnea returned and unchanged since the last time we visited. Denies chest pain, headaches, dizziness. Denies unexplained fevers, chills, unintentional weight loss, night sweats or enlarged lymph nodes that he has noticed. Denies any personal hx of cancer. Had a brother with lung cancer. Denies thoracic surgery, radiation or any procedures. Denies joint pain or rash.  Social History: Fromer smoker. Quit in 1980. 24 pack-years.  Environmental exposures: No known chemical, raw material or environmental exposures.  I have personally reviewed patient's past medical/family/social history, allergies, current medications.  Past Medical History:  Diagnosis Date  . Atrial fibrillation ([place]) 2008  . Colorectal polyps 1985  . Duodenal ulcer 1968  . H/O knee surgery 1990  . History of pulmonary embolism 2007  . Hyperlipidemia   . Hypertension      Family History  Problem Relation Age of Onset  . Heart attack Father   . Cancer - Lung Brother      Social History   Occupational History  . Occupation: retired  Tobacco Use  . Smoking status: Former Smoker    Packs/day: 2.00    Years: 12.00   Pack years: 24.00    Types: Cigarettes    Last attempt to quit: 12/18/1978    Years since quitting: 40.1  . Smokeless tobacco: Never Used  Substance and Sexual Activity  . Alcohol use: No  . Drug use: No  . Sexual activity: Not Currently    Partners: Female    Allergies  Allergen Reactions  . Pradaxa [Dabigatran Etexilate Mesylate] Other (See Comments)    Hemoptysis     Outpatient Medications Prior to Visit  Medication Sig Dispense Refill  . alendronate (FOSAMAX) 70 MG tablet Take 1 tablet by mouth once a week.    Marland Kitchen apixaban (ELIQUIS) 5 MG TABS tablet Take 1 tablet (5 mg total) by mouth 2 (two) times daily. 180 tablet 2  . digoxin (LANOXIN) 0.125 MG tablet Take 1 tablet (125 mcg total) by mouth daily. 90 tablet 3  . diltiazem (CARDIZEM CD) 300 MG 24 hr capsule Take 1 capsule (300 mg total) by mouth daily. 90 capsule 3  . irbesartan-hydrochlorothiazide (AVALIDE) 150-12.5 MG tablet Take 1 tablet by mouth every day 60 tablet 0  . mometasone (NASONEX) 50 MCG/ACT nasal spray Place 2 sprays into the nose daily as needed.     . Omega-3 Fatty Acids (FISH OIL) 1000 MG CAPS Take 1,000 mg by mouth 2 (two) times daily.    . pantoprazole (PROTONIX) 40 MG tablet Take 1 tablet by mouth every day 60 tablet 0  . potassium chloride SA (K-DUR,KLOR-CON) 20 MEQ tablet Take 1 tablet by mouth every day 60 tablet 0  . pravastatin (PRAVACHOL) 80 MG tablet  Take 1 tablet by mouth every day 60 tablet 0  . tamsulosin (FLOMAX) 0.4 MG CAPS capsule TAKE 1 CAPSULE BY MOUTH  DAILY. 90 capsule 1   No facility-administered medications prior to visit.     Review of Systems  Constitutional: Negative for chills, diaphoresis, fever, malaise/fatigue and weight loss.  HENT: Negative for congestion, ear pain and sore throat.   Respiratory: Positive for cough and shortness of breath. Negative for hemoptysis, sputum production and wheezing.   Cardiovascular: Negative for chest pain, palpitations and leg swelling.    Gastrointestinal: Negative for abdominal pain, heartburn and nausea.  Genitourinary: Negative for frequency.  Musculoskeletal: Negative for joint pain and myalgias.  Skin: Negative for itching and rash.  Neurological: Negative for dizziness, weakness and headaches.  Endo/Heme/Allergies: Does not bruise/bleed easily.  Psychiatric/Behavioral: Negative for depression. The patient is not nervous/anxious.      Objective:   Vitals:   02/10/19 0916  BP: 130/78  Pulse: (!) 102  SpO2: 95%  Weight: 168 lb 5.8 oz (76.4 kg)  Height: 5' 7"  (1.702 m)   SpO2: 95 % O2 Device: None (Room air)  Physical Exam General: Well-appearing, no acute distress HENT: [place], AT, OP clear, MMM Eyes: EOMI, no scleral icterus Respiratory: Decreased air movement in right lower base.  No crackles, wheezing or rales Cardiovascular: RRR, -M/R/G, no JVD GI: BS+, soft, nontender Extremities:-Edema,-tenderness Neuro: AAO x4, CNII-XII grossly intact Skin: Intact, no rashes or bruising Psych: Normal mood, normal affect  Chest imaging: CXR 02/10/19 - Recurrent small/moderate right pleural effusion  PFT:  Imaging, labs and tests noted above have been reviewed independently by me.    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 Concerned for malignancy. Denies history of thoracic surgery or any recent procedures.  Denies history of malignancy.  No current B symptoms.  After thoracentesis he briefly felt improved dyspnea on exertion however states that his dyspnea is unchanged overall.  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  We will arrange for repeat IR thoracentesis for which she will need to hold his anticoagulation.  We will also plan to obtain labs and imaging below for further evaluation of etiology of his chylothorax.  Plan: CT Chest abdomen pelvis without contrast Labs:  CBC with diff, eosinophils, complete metabolic panel  Orders Placed This Encounter  Procedures  . DG Chest 2 View    Standing Status:   Future    Number of Occurrences:   1    Standing Expiration Date:   04/10/2020    Order Specific Question:   Reason for Exam (SYMPTOM  OR DIAGNOSIS REQUIRED)    Answer:   Right Plueral Effusion    Order Specific Question:   Preferred imaging location?    Answer:   Internal    Order Specific Question:   Radiology Contrast Protocol - do NOT remove file path    Answer:   \\[email or web address]\DXFluoroContrastProtocols.pdf  . IR THORACENTESIS ASP PLEURAL SPACE W/IMG GUIDE    Hold Eliquis for 3 days prior to procedure    Standing Status:   Future    Standing Expiration Date:   04/10/2020    Order Specific Question:   Are labs required for specimen collection?    Answer:   Yes    Order Specific Question:   Lab orders requested (DO NOT place separate lab orders, these will be automatically ordered during procedure specimen collection):  Answer:   Triglycerides, Body Fluids    Order Specific Question:   Lab orders requested (DO NOT place separate lab orders, these will be automatically ordered during procedure specimen collection):    Answer:   Body Fluid Cell Count With Differential    Order Specific Question:   Lab orders requested (DO NOT place separate lab orders, these will be automatically ordered during procedure specimen collection):    Answer:   Fungus Culture With Stain    Order Specific Question:   Lab orders requested (DO NOT place separate lab orders, these will be automatically ordered during procedure specimen collection):    Answer:   Acid Fast Smear (Afb)    Order Specific Question:   Lab orders requested (DO NOT place separate lab orders, these will be automatically ordered during procedure specimen collection):    Answer:   Cytology - Non Pap    Order Specific Question:   Lab orders requested (DO NOT place separate lab orders, these  will be automatically ordered during procedure specimen collection):    Answer:   Acid Fast Culture With Reflexed Sensitivities    Order Specific Question:   Lab orders requested (DO NOT place separate lab orders, these will be automatically ordered during procedure specimen collection):    Answer:   Amylase, Body Fluid    Order Specific Question:   Lab orders requested (DO NOT place separate lab orders, these will be automatically ordered during procedure specimen collection):    Answer:   Cholesterol, Body Fluid    Order Specific Question:   Lab orders requested (DO NOT place separate lab orders, these will be automatically ordered during procedure specimen collection):    Answer:   Glucose, Body Fluid    Order Specific Question:   Lab orders requested (DO NOT place separate lab orders, these will be automatically ordered during procedure specimen collection):    Answer:   Lactate Dehydrogenase, Body Fluid    Order Specific Question:   Lab orders requested (DO NOT place separate lab orders, these will be automatically ordered during procedure specimen collection):    Answer:   Protein, Pleural Or Peritoneal Fluid    Order Specific Question:   Lab orders requested (DO NOT place separate lab orders, these will be automatically ordered during procedure specimen collection):    Answer:   Comp Panel: Leukemia/Lymphoma    Order Specific Question:   Reason for Exam (SYMPTOM  OR DIAGNOSIS REQUIRED)    Answer:   Right Pleural Effusion    Order Specific Question:   Preferred Imaging Location?    Answer:   [place] CONTRAST    Standing Status:   Future    Standing Expiration Date:   05/10/2020    Order Specific Question:   Preferred imaging location?    Answer:   [facility]    Order Specific Question:   Is Oral Contrast requested for this exam?    Answer:   No oral contrast    Order Specific Question:   Reason for No Oral Contrast    Answer:   Medical  necessity (Time Sensitive)    Order Specific Question:   Radiology Contrast Protocol - do NOT remove file path    Answer:   \\[email or web address]\Radiant\CTProtocols.pdf  . CT Chest Wo Contrast    Standing Status:   Future    Standing Expiration Date:   04/10/2020    Order Specific Question:   Preferred imaging location?  Answer:   [facility]    Order Specific Question:   Radiology Contrast Protocol - do NOT remove file path    Answer:   \\[email or web address]\Radiant\CTProtocols.pdf  . CBC w/Diff    Standing Status:   Future    Number of Occurrences:   1    Standing Expiration Date:   02/10/2020  . Comp Met (CMET)    Standing Status:   Future    Number of Occurrences:   1    Standing Expiration Date:   02/10/2020  No orders of the defined types were placed in this encounter.   No follow-ups on file.  Chi Rodman Pickle, MD [facility] 02/10/2019 7:34 PM  Personal pager: [phone] If unanswered, please page CCM On-call: [phone]

## 2019-02-11 ENCOUNTER — Ambulatory Visit: Payer: Medicare Other | Admitting: Physician Assistant

## 2019-02-11 ENCOUNTER — Ambulatory Visit: Payer: Medicare Other

## 2019-02-11 ENCOUNTER — Encounter: Payer: Self-pay | Admitting: Physician Assistant

## 2019-02-11 VITALS — BP 146/76 | HR 91 | Ht 67.0 in | Wt 169.4 lb

## 2019-02-11 DIAGNOSIS — J940 Chylous effusion: Secondary | ICD-10-CM

## 2019-02-11 DIAGNOSIS — I10 Essential (primary) hypertension: Secondary | ICD-10-CM

## 2019-02-11 DIAGNOSIS — E782 Mixed hyperlipidemia: Secondary | ICD-10-CM

## 2019-02-11 DIAGNOSIS — I898 Other specified noninfective disorders of lymphatic vessels and lymph nodes: Secondary | ICD-10-CM | POA: Diagnosis not present

## 2019-02-11 DIAGNOSIS — I4811 Longstanding persistent atrial fibrillation: Secondary | ICD-10-CM

## 2019-02-11 MED ORDER — DILTIAZEM HCL ER COATED BEADS 360 MG PO CP24: 360.0000 mg | ORAL_CAPSULE | Freq: Every day | ORAL | 11 refills | Status: DC

## 2019-02-11 NOTE — Patient Instructions (Signed)
Medication Instructions:  Your physician has recommended you make the following change in your medication:   INCREASE: diltiazem to 360 mg once a day  Lab work: None Ordered  If you have labs (blood work) drawn today and your tests are completely normal, you will receive your results only by: Marland Kitchen MyChart Message (if you have MyChart) OR . A paper copy in the mail If you have any lab test that is abnormal or we need to change your treatment, we will call you to review the results.  Testing/Procedures: Your physician has recommended that you wear an event monitor. Appointment TODAY at 3:00 PM. Event monitors are medical devices that record the heart's electrical activity. Doctors most often [place] these monitors to diagnose arrhythmias. Arrhythmias are problems with the speed or rhythm of the heartbeat. The monitor is a small, portable device. You can wear one while you do your normal daily activities. This is usually used to diagnose what is causing palpitations/syncope (passing out).   Follow-Up: . Follow up with [name], PA on 03/18/19 at 12:30 PM  Any Other Special Instructions Will Be Listed Below (If Applicable).

## 2019-02-11 NOTE — Progress Notes (Signed)
Cardiology Office Note    Date:  02/11/2019   ID:  Nicholas Cruz, DOB [birth year], MRN [id number]  PCP:  Nicholas Huddle, MD  Cardiologist: Nicholas Grooms, MD EPS: None  Chief Complaint  Patient presents with  . Follow-up  . Shortness of Breath    History of Present Illness:  Nicholas Cruz is a 85 y.o. male with chronic atrial fibrillation on Eliquis, hypertension, HLD.  Saw Nicholas Cruz 01/17/2019 at which time heart rate was 120 bpm and blood pressure elevated.  He increased his diltiazem to 300 mg daily.  Patient was having trouble with pleural effusions that have been tapped and he felt that the higher heart rate could lead to more diastolic dysfunction and pleural effusions and what his heart rate slowed.  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 2D echo in 12/2018 showed dilated atria related to his atrial fibrillation normal LVEF.    Nicholas Cruz wanted the patient to be rechecked.  Heart rate was 102 yesterday but patient says was jumping up to 160/m.  He did not feel it.  Patient has been taking cough syrup every night that could be contributing to faster HR. Didn't use last night. Doesn't know what's in it.  Says he can lay flat without any difficulty but gets short of breath with little exertion.  Scheduled for thoracentesis on Friday and CT on Monday.  Past Medical History:  Diagnosis Date  . Atrial fibrillation ([place]) 2008  . Colorectal polyps 1985  . Duodenal ulcer 1968  . H/O knee surgery 1990  . History of pulmonary embolism 2007  . Hyperlipidemia   . Hypertension     Past Surgical History:  Procedure Laterality Date  . IR THORACENTESIS ASP PLEURAL SPACE W/IMG GUIDE  12/30/2018  . KNEE SURGERY  1990   [facility]    Current Medications: Current Meds  Medication Sig  . alendronate  (FOSAMAX) 70 MG tablet Take 1 tablet by mouth once a week.  Marland Kitchen apixaban (ELIQUIS) 5 MG TABS tablet Take 1 tablet (5 mg total) by mouth 2 (two) times daily.  . digoxin (LANOXIN) 0.125 MG tablet Take 1 tablet (125 mcg total) by mouth daily.  . irbesartan-hydrochlorothiazide (AVALIDE) 150-12.5 MG tablet Take 1 tablet by mouth every day  . mometasone (NASONEX) 50 MCG/ACT nasal spray Place 2 sprays into the nose daily as needed.   . Omega-3 Fatty Acids (FISH OIL) 1000 MG CAPS Take 1,000 mg by mouth 2 (two) times daily.  . pantoprazole (PROTONIX) 40 MG tablet Take 1 tablet by mouth every day  . potassium chloride SA (K-DUR,KLOR-CON) 20 MEQ tablet Take 1 tablet by mouth every day  . pravastatin (PRAVACHOL) 80 MG tablet Take 1 tablet by mouth every day  . tamsulosin (FLOMAX) 0.4 MG CAPS capsule TAKE 1 CAPSULE BY MOUTH  DAILY.  . [DISCONTINUED] diltiazem (CARDIZEM CD) 300 MG 24 hr capsule Take 1 capsule (300 mg total) by mouth daily.     Allergies:   Nicholas Cruz [dabigatran etexilate mesylate]   Social History   Socioeconomic History  . Marital status: Married    Spouse name: Nicholas Cruz  . Number of children: 5  . Years of education: Not on file  . Highest education level: Not on file  Occupational History  . Occupation: retired  Scientific laboratory technician  . Financial resource strain: Not on file  .  Food insecurity:    Worry: Not on file    Inability: Not on file  . Transportation needs:    Medical: Not on file    Non-medical: Not on file  Tobacco Use  . Smoking status: Former Smoker    Packs/day: 2.00    Years: 12.00    Pack years: 24.00    Types: Cigarettes    Last attempt to quit: 12/18/1978    Years since quitting: 40.1  . Smokeless tobacco: Never Used  Substance and Sexual Activity  . Alcohol use: No  . Drug use: No  . Sexual activity: Not Currently    Partners: Female  Lifestyle  . Physical activity:    Days per week: Not on file    Minutes per session: Not on file  . Stress: Not on file    Relationships  . Social connections:    Talks on phone: Not on file    Gets together: Not on file    Attends religious service: Not on file    Active member of club or organization: Not on file    Attends meetings of clubs or organizations: Not on file    Relationship status: Not on file  Other Topics Concern  . Not on file  Social History Narrative   1 caffeine drink per day. He is fairly active but no regular exercise. He's retired and has a high school degree. His wife's name is Nicholas Cruz     Family History:  The patient's   family history includes Cancer - Lung in his brother; Heart attack in his father.   ROS:   Please see the history of present illness.    Review of Systems  Cardiovascular: Positive for dyspnea on exertion and irregular heartbeat.   All other systems reviewed and are negative.   PHYSICAL EXAM:   VS:  BP (!) 146/76   Pulse 91   Ht 5\' 7"  (1.702 m)   Wt 169 lb 6.4 oz (76.8 kg)   SpO2 97%   BMI 26.53 kg/m   Physical Exam  GEN: Well nourished, well developed, in no acute distress  Neck: no JVD, carotid bruits, or masses Cardiac: Irregular irregular; no murmurs, rubs, or gallops  Respiratory: Lungs clear on the left decreased breath sounds in the right GI: soft, nontender, nondistended, + BS Ext: without cyanosis, clubbing, or edema, Good distal pulses bilaterally Neuro:  Alert and Oriented x 3 Psych: euthymic mood, full affect  Wt Readings from Last 3 Encounters:  02/11/19 169 lb 6.4 oz (76.8 kg)  02/10/19 168 lb 5.8 oz (76.4 kg)  01/17/19 171 lb 6.4 oz (77.7 kg)      Studies/Labs Reviewed:   EKG:  EKG is not ordered today.    Recent Labs: 02/10/2019: ALT 10; BUN 22; Creatinine, Ser 1.24; Hemoglobin 14.9; Platelets 219.0; Potassium 4.7; Sodium 137   Lipid Panel    Component Value Date/Time   CHOL 180 03/28/2016 0728   TRIG 179 (H) 03/28/2016 0728   HDL 37 (L) 03/28/2016 0728   CHOLHDL 4.9 03/28/2016 0728   VLDL 36 (H) 03/28/2016 0728    LDLCALC 107 03/28/2016 0728   LDLDIRECT 86.0 05/13/2015 0739    Additional studies/ records that were reviewed today include:  2D echo with bubble study 1/16/2020Study Conclusions   - Left ventricle: The cavity size was normal. There was mild   concentric hypertrophy. Systolic function was normal. The   estimated ejection fraction was in the range of 60% to 65%. Wall  motion was normal; there were no regional wall motion   abnormalities. - Aortic valve: Transvalvular velocity was within the normal range.   There was no stenosis. There was mild regurgitation. - Mitral valve: Transvalvular velocity was within the normal range.   There was no evidence for stenosis. There was mild regurgitation. - Left atrium: The atrium was severely dilated. - Right ventricle: The cavity size was normal. Wall thickness was   normal. Systolic function was normal. - Right atrium: The atrium was mildly dilated. - Atrial septum: No defect or patent foramen ovale was identified   by color flow Doppler or saline microcavitation study. - Tricuspid valve: There was mild regurgitation. - Pulmonary arteries: Systolic pressure was within the normal   range. PA peak pressure: 31 mm Hg (S).       ASSESSMENT:    1. Longstanding persistent atrial fibrillation   2. Essential hypertension, benign   3. 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  Rates were up to 160 bpm when seeing Nicholas Cruz yesterday.  Patient admits to taking cough syrup at night but does not know what other ingredients are in it.  He is asymptomatic with the fast heart rates other than his recurrent effusion and dyspnea on exertion.  Will increase diltiazem to 360 mg daily.  Place live monitor on him today.  He will call us back and let us know what cough medication he is taking at night  to see if it could be contributing to his tachycardia.  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  Concerns regarding medicines are outlined above.  Medication changes, Labs and Tests ordered today are listed in the Patient Instructions below. Patient Instructions  Medication Instructions:  Your physician has recommended you make the following change in your medication:   INCREASE: diltiazem to 360 mg once a day  Lab work: None Ordered  If you have labs (blood work) drawn today and your tests are completely normal, you will receive your results only by: Marland Kitchen MyChart Message (if you have MyChart) OR . A paper copy in the mail If you have any lab test that is abnormal or we need to change your treatment, we will call you to review the results.  Testing/Procedures: Your physician has recommended that you wear an event monitor. Appointment TODAY at 3:00 PM. Event monitors are medical devices that record the heart's electrical activity. Doctors most often [place] these monitors to diagnose arrhythmias. Arrhythmias are problems with the speed or rhythm of the heartbeat. The monitor is a small, portable device. You can wear one while you do your normal daily activities. This is usually used to diagnose what is causing palpitations/syncope (passing out).   Follow-Up: . Follow up with [name], PA on 03/18/19 at 12:30 PM  Any Other Special Instructions Will Be Listed Below (If Applicable).       Signed, [name], PA-C  02/11/2019 2:35 PM    [facility] [place], [place], [place]  [address] Phone: [phone]; Fax: [phone]

## 2019-02-12 ENCOUNTER — Telehealth: Payer: Self-pay | Admitting: Pulmonary Disease

## 2019-02-12 NOTE — Telephone Encounter (Signed)
Spoke with pt and he is scheduled to have an event monitor on 02/21/2019 and he was wondering if he should  have it done sooner? Dr Loanne Drilling please advise.

## 2019-02-13 NOTE — Telephone Encounter (Signed)
I recommend keeping the appointment date as it stands. Doing the event monitor after his pleural fluid is drained will likely be more reflective of finding any primary cardiac issues.  JE

## 2019-02-13 NOTE — Telephone Encounter (Signed)
Called and spoke with Patient.  Dr Loanne Drilling recommendations given.  Understanding stated. Nothing further at this time.

## 2019-02-14 ENCOUNTER — Ambulatory Visit (HOSPITAL_COMMUNITY)
Admission: RE | Admit: 2019-02-14 | Discharge: 2019-02-14 | Disposition: A | Discharge status: Home / Self Care | Payer: Medicare Other | Source: Ambulatory Visit | Attending: Pulmonary Disease | Admitting: Pulmonary Disease

## 2019-02-14 ENCOUNTER — Encounter (HOSPITAL_COMMUNITY): Payer: Self-pay

## 2019-02-14 DIAGNOSIS — J90 Pleural effusion, not elsewhere classified: Secondary | ICD-10-CM

## 2019-02-14 MED ORDER — LIDOCAINE HCL 1 % IJ SOLN
INTRAMUSCULAR | Status: AC
  Filled 2019-02-14: qty 20

## 2019-02-14 NOTE — Progress Notes (Signed)
Patient presented for thoracentesis today in IR - he reports that he did not hold his Eliquis as instructed. Discussed with patient and his family that this procedure can still be performed on Eliquis however there is an increased risk of bleeding. Offered to perform procedure as scheduled however patient prefers to hold Eliquis and reschedule procedure for Monday. He is scheduled for a CT chest/abdomen/pelvis on Monday 3/2 at [facility] - we have rescheduled his thoracentesis for Monday 3/2 at 9 am with CT to follow in lieu of post procedure CXR.   Please call IR with questions or concerns.  Candiss Norse, PA-C Pager# [phone]

## 2019-02-17 ENCOUNTER — Encounter (HOSPITAL_COMMUNITY): Payer: Self-pay

## 2019-02-17 ENCOUNTER — Ambulatory Visit (HOSPITAL_COMMUNITY)
Admission: RE | Admit: 2019-02-17 | Discharge: 2019-02-17 | Disposition: A | Discharge status: Home / Self Care | Payer: Medicare Other | Source: Ambulatory Visit | Attending: Pulmonary Disease | Admitting: Pulmonary Disease

## 2019-02-17 ENCOUNTER — Other Ambulatory Visit: Payer: Self-pay | Admitting: Pulmonary Disease

## 2019-02-17 ENCOUNTER — Telehealth: Payer: Self-pay

## 2019-02-17 DIAGNOSIS — D72820 Lymphocytosis (symptomatic): Secondary | ICD-10-CM | POA: Insufficient documentation

## 2019-02-17 DIAGNOSIS — J940 Chylous effusion: Secondary | ICD-10-CM

## 2019-02-17 DIAGNOSIS — J90 Pleural effusion, not elsewhere classified: Secondary | ICD-10-CM | POA: Insufficient documentation

## 2019-02-17 DIAGNOSIS — I2510 Atherosclerotic heart disease of native coronary artery without angina pectoris: Secondary | ICD-10-CM | POA: Insufficient documentation

## 2019-02-17 DIAGNOSIS — I898 Other specified noninfective disorders of lymphatic vessels and lymph nodes: Secondary | ICD-10-CM

## 2019-02-17 DIAGNOSIS — R590 Localized enlarged lymph nodes: Secondary | ICD-10-CM | POA: Diagnosis not present

## 2019-02-17 HISTORY — PX: IR THORACENTESIS ASP PLEURAL SPACE W/IMG GUIDE: IMG5380

## 2019-02-17 LAB — AMYLASE, PLEURAL OR PERITONEAL FLUID: Amylase, Fluid: 39 U/L

## 2019-02-17 LAB — BODY FLUID CELL COUNT WITH DIFFERENTIAL
Eos, Fluid: 1 %
Lymphs, Fluid: 88 %
Monocyte-Macrophage-Serous Fluid: 11 % — ABNORMAL LOW (ref 50–90)
Neutrophil Count, Fluid: 0 % (ref 0–25)
Total Nucleated Cell Count, Fluid: 2170 cu mm — ABNORMAL HIGH (ref 0–1000)

## 2019-02-17 LAB — PROTEIN, PLEURAL OR PERITONEAL FLUID

## 2019-02-17 LAB — LACTATE DEHYDROGENASE, PLEURAL OR PERITONEAL FLUID: LD, Fluid: 123 U/L — ABNORMAL HIGH (ref 3–23)

## 2019-02-17 LAB — GLUCOSE, PLEURAL OR PERITONEAL FLUID: Glucose, Fluid: 100 mg/dL

## 2019-02-17 MED ORDER — LIDOCAINE HCL 1 % IJ SOLN
INTRAMUSCULAR | Status: AC
  Filled 2019-02-17: qty 20

## 2019-02-17 MED ORDER — LIDOCAINE HCL 1 % IJ SOLN
INTRAMUSCULAR | Status: DC | PRN
  Administered 2019-02-17: 10 mL

## 2019-02-17 MED ORDER — IOHEXOL 300 MG/ML  SOLN
100.0000 mL | Freq: Once | INTRAMUSCULAR | Status: AC | PRN
  Administered 2019-02-17: 100 mL via INTRAVENOUS

## 2019-02-17 NOTE — Procedures (Signed)
PROCEDURE SUMMARY:  Successful US guided diagnostic and therapeutic right thoracentesis. Yielded 1.3 liters of chylous fluid. Pt tolerated procedure well. No immediate complications.  Specimen was sent for labs. CXR ordered.  EBL < 5 mL  Docia Barrier PA-C 02/17/2019 9:36 AM

## 2019-02-17 NOTE — Telephone Encounter (Signed)
Dr. Loanne Drilling returned phone call, okay to do CT of both abdomen and chest with contrast. Call made to [facility] to make her aware. Nothing further is needed at this time.

## 2019-02-17 NOTE — Telephone Encounter (Signed)
Chelsea from [facility] Chest CT department, The radiologist  is needing to know whether she really wants the Chest CT without contrast.   Call placed to Dr. Loanne Drilling did not receive a answer, per the AVS without contrast.   Will forward message to Dr. Loanne Drilling as they are going to hold the patient there for 15 minutes.

## 2019-02-17 NOTE — Telephone Encounter (Signed)
Have radiology call me at [phone]. Yes, I want CT Chest Abdomen and Pelvis without contrast.

## 2019-02-18 LAB — ACID FAST SMEAR (AFB, MYCOBACTERIA): Acid Fast Smear: NEGATIVE

## 2019-02-18 LAB — TRIGLYCERIDES, BODY FLUIDS: Triglycerides, Fluid: 1194 mg/dL

## 2019-02-20 LAB — CHOLESTEROL, BODY FLUID: Cholesterol, Fluid: 79 mg/dL

## 2019-02-21 ENCOUNTER — Ambulatory Visit (INDEPENDENT_AMBULATORY_CARE_PROVIDER_SITE_OTHER): Payer: Medicare Other

## 2019-02-21 ENCOUNTER — Telehealth: Payer: Self-pay | Admitting: Nurse Practitioner

## 2019-02-21 DIAGNOSIS — I4811 Longstanding persistent atrial fibrillation: Secondary | ICD-10-CM

## 2019-02-21 DIAGNOSIS — I898 Other specified noninfective disorders of lymphatic vessels and lymph nodes: Secondary | ICD-10-CM

## 2019-02-21 DIAGNOSIS — I10 Essential (primary) hypertension: Secondary | ICD-10-CM

## 2019-02-21 DIAGNOSIS — E782 Mixed hyperlipidemia: Secondary | ICD-10-CM

## 2019-02-21 NOTE — Telephone Encounter (Signed)
Patient was here today for application of monitor and reported call was for baseline rate and rhythm

## 2019-02-21 NOTE — Telephone Encounter (Signed)
Received call from [employer] with report that patient is having atrial flutter with rate of 100 bpm. He states the report is being faxed.  Upon chart review I see that the patient had a GXT today at our office. Will await fax.

## 2019-02-24 ENCOUNTER — Telehealth: Payer: Self-pay | Admitting: Pulmonary Disease

## 2019-02-24 ENCOUNTER — Other Ambulatory Visit: Payer: Self-pay

## 2019-02-24 ENCOUNTER — Inpatient Hospital Stay: Admission: RE | Admit: 2019-02-24 | Payer: Medicare Other | Source: Ambulatory Visit

## 2019-02-24 DIAGNOSIS — J940 Chylous effusion: Secondary | ICD-10-CM

## 2019-02-24 DIAGNOSIS — I898 Other specified noninfective disorders of lymphatic vessels and lymph nodes: Secondary | ICD-10-CM

## 2019-02-24 NOTE — Telephone Encounter (Signed)
Patient has chronic Afib. I placed monitor for rate control. I didn't order a GXT and don't see one in chart? Thanks, Selinda Eon

## 2019-02-24 NOTE — Telephone Encounter (Signed)
Pulmonary Telephone Encounter  Date: 02/24/19 Time: 12:45 PM Spoke with: Nicholas Cruz  We discussed recent CT imaging results. Explained that test was obtained to rule out malignancy, thoracic duct leakage or trauma. There was no evidence of this on imaging. I advised conservative management with high-protein, low-fat (<10 g fat/day) diet. We discussed how he may require additional imaging and testing (e.g. 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 Will scheduled for repeat CT Chest during that visit. Advised patient to contact office if symptoms worsen prior.  Rodman Pickle, M.D. [facility] Pulmonary/Critical Care Medicine Pager: [phone] After hours pager: [phone]

## 2019-02-24 NOTE — Telephone Encounter (Signed)
Result note has full details of this encounter.  Patient was contacted and OV scheduled with Dr. Loanne Drilling 04/01/19 with cxr prior to exam.  Nothing further needed.

## 2019-02-24 NOTE — Telephone Encounter (Signed)
Appointment was for monitor placement not GXT. Please excuse the confusion.

## 2019-03-07 ENCOUNTER — Encounter: Payer: Self-pay | Admitting: Physician Assistant

## 2019-03-11 ENCOUNTER — Telehealth: Payer: Self-pay | Admitting: Physician Assistant

## 2019-03-11 NOTE — Telephone Encounter (Signed)
I spoke with patient about his upcoming visit with me 03/18/2019 12:30 PM due to the coronavirus.  Patient is currently wearing a 30-day monitor that was placed 02/21/2019.  He has no active cardiac problems and is agreeable to a telephone visit after his monitor results are back which I assume will be after 03/24/2019.  Please make a telephone visit with me after we get the results of the 30-day monitor.  Thank you     Primary Cardiologist:  Larae Grooms, MD        [name], PA-C  03/11/2019 2:41 PM         .

## 2019-03-11 NOTE — Progress Notes (Deleted)
{Choose 1 Note Type (Telehealth Visit or Telephone Visit):[phone]}   Evaluation Performed:  Follow-up visit  This visit type was conducted due to national recommendations for restrictions regarding the COVID-19 Pandemic (e.g. social distancing).  This format is felt to be most appropriate for this patient at this time.  All issues noted in this document were discussed and addressed.  No physical exam was performed (except for noted visual exam findings with Telehealth visits).  See MyChart message from today for the patient's consent to telehealth for [facility].  Date:  03/11/2019   ID:  Nicholas Cruz, DOB [birth year], MRN [id number]  Patient Location:  ***[address] [place] [address]   Provider location:   ***  PCP:  Josetta Huddle, MD  Cardiologist:  Larae Grooms, MD *** Electrophysiologist:  None   Chief Complaint:  ***  History of Present Illness:    Nicholas Cruz is a 85 y.o. male who presents via audio/video conferencing for a telehealth visit today.  ***  The patient {does/does not:200015} symptoms concerning for COVID-19 infection (fever, chills, cough, or new SHORTNESS OF BREATH).    Prior CV studies:   The following studies were reviewed today:  ***  Past Medical History:  Diagnosis Date  . Atrial fibrillation ([employer]) 2008  . Colorectal polyps 1985  . Duodenal ulcer 1968  . H/O knee surgery 1990  . History of pulmonary embolism 2007  . Hyperlipidemia   . Hypertension    Past Surgical History:  Procedure Laterality Date  . IR THORACENTESIS ASP PLEURAL SPACE W/IMG GUIDE  12/30/2018  . IR THORACENTESIS ASP PLEURAL SPACE W/IMG GUIDE  02/17/2019  . KNEE SURGERY  1990   [facility]     No outpatient medications have been marked as taking for the 03/18/19 encounter (Appointment) with Imogene Burn, PA-C.     Allergies:   Pradaxa [dabigatran etexilate mesylate]   Social History   Tobacco Use  . Smoking status: Former Smoker    Packs/day: 2.00     Years: 12.00    Pack years: 24.00    Types: Cigarettes    Last attempt to quit: 12/18/1978    Years since quitting: 40.2  . Smokeless tobacco: Never Used  Substance Use Topics  . Alcohol use: No  . Drug use: No     Family Hx: The patient's family history includes Cancer - Lung in his brother; Heart attack in his father.  ROS:   Please see the history of present illness.    *** All other systems reviewed and are negative.   Labs/Other Tests and Data Reviewed:    Recent Labs: 02/10/2019: ALT 10; BUN 22; Creatinine, Ser 1.24; Hemoglobin 14.9; Platelets 219.0; Potassium 4.7; Sodium 137   Recent Lipid Panel Lab Results  Component Value Date/Time   CHOL 180 03/28/2016 07:28 AM   TRIG 179 (H) 03/28/2016 07:28 AM   HDL 37 (L) 03/28/2016 07:28 AM   CHOLHDL 4.9 03/28/2016 07:28 AM   LDLCALC 107 03/28/2016 07:28 AM   LDLDIRECT 86.0 05/13/2015 07:39 AM    Wt Readings from Last 3 Encounters:  02/11/19 169 lb 6.4 oz (76.8 kg)  02/10/19 168 lb 5.8 oz (76.4 kg)  01/17/19 171 lb 6.4 oz (77.7 kg)     Exam:    Vital Signs:  There were no vitals taken for this visit.   Well nourished, well developed male in no*** acute distress. ***  ASSESSMENT & PLAN:    1.  ***  COVID-19 Education:  The signs and symptoms of COVID-19 were discussed with the patient and how to seek care for testing (follow up with PCP or arrange E-visit).  ***The importance of social distancing was discussed today.  Patient Risk:   After full review of this patients clinical status, I feel that they are at least moderate risk at this time.  Time:   Today, I have spent *** minutes with the patient with telehealth technology discussing ***.     Medication Adjustments/Labs and Tests Ordered: Current medicines are reviewed at length with the patient today.  Concerns regarding medicines are outlined above.  Tests Ordered: No orders of the defined types were placed in this encounter.  Medication  Changes: No orders of the defined types were placed in this encounter.   Disposition:  {follow up:15908}  Signed, [name], PA-C  03/11/2019 2:42 PM    [facility]

## 2019-03-14 NOTE — Telephone Encounter (Signed)
Virtual Visit Pre-Appointment Phone Call  TELEPHONE CALL NOTE  Nicholas Cruz has been deemed a candidate for a follow-up tele-health visit to limit community exposure during the Covid-19 pandemic. I spoke with the patient via phone to ensure availability of phone/video source, confirm preferred email & phone number, and discuss instructions and expectations.  I reminded [name] to be prepared with any vital sign and/or heart rhythm information that could potentially be obtained via home monitoring, at the time of his visit. I reminded KAYLE CORREA to expect a phone call at the time of his visit if his visit.  Did the patient verbally acknowledge consent to treatment? YES   Patient was scheduled for TELEPHONE Visit with [name], PA on 4/15 at 11:30.  [name], RN 03/14/2019 12:52 PM   Patient consnets   CONSENT FOR TELE-HEALTH VISIT - PLEASE REVIEW  I hereby voluntarily request, consent and authorize [facility] and its employed or contracted physicians, physician assistants, nurse practitioners or other licensed health care professionals (the Practitioner), to provide me with telemedicine health care services (the "Services") as deemed necessary by the treating Practitioner. I acknowledge and consent to receive the Services by the Practitioner via telemedicine. I understand that the telemedicine visit will involve communicating with the Practitioner through live audiovisual communication technology and the disclosure of certain medical information by electronic transmission. I acknowledge that I have been given the opportunity to request an in-person assessment or other available alternative prior to the telemedicine visit and am voluntarily participating in the telemedicine visit.  I understand that I have the right to withhold or withdraw my consent to the use of telemedicine in the course of my care at any time, without affecting my right to future care or  treatment, and that the Practitioner or I may terminate the telemedicine visit at any time. I understand that I have the right to inspect all information obtained and/or recorded in the course of the telemedicine visit and may receive copies of available information for a reasonable fee.  I understand that some of the potential risks of receiving the Services via telemedicine include:  Marland Kitchen Delay or interruption in medical evaluation due to technological equipment failure or disruption; . Information transmitted may not be sufficient (e.g. poor resolution of images) to allow for appropriate medical decision making by the Practitioner; and/or  . In rare instances, security protocols could fail, causing a breach of personal health information.  Furthermore, I acknowledge that it is my responsibility to provide information about my medical history, conditions and care that is complete and accurate to the best of my ability. I acknowledge that Practitioner's advice, recommendations, and/or decision may be based on factors not within their control, such as incomplete or inaccurate data provided by me or distortions of diagnostic images or specimens that may result from electronic transmissions. I understand that the practice of medicine is not an exact science and that Practitioner makes no warranties or guarantees regarding treatment outcomes. I acknowledge that I will receive a copy of this consent concurrently upon execution via email to the email address I last provided but may also request a printed copy by calling the office of [facility].    I understand that my insurance will be billed for this visit.   I have read or had this consent read to me. . I understand the contents of this consent, which adequately explains the benefits and risks of the Services being provided via  telemedicine.  . I have been provided ample opportunity to ask questions regarding this consent and the Services and have had my  questions answered to my satisfaction. . I give my informed consent for the services to be provided through the use of telemedicine in my medical care  By participating in this telemedicine visit I agree to the above.

## 2019-03-18 ENCOUNTER — Ambulatory Visit: Payer: Medicare Other | Admitting: Physician Assistant

## 2019-03-18 LAB — FUNGAL ORGANISM REFLEX

## 2019-03-18 LAB — FUNGUS CULTURE WITH STAIN

## 2019-03-18 LAB — FUNGUS CULTURE RESULT

## 2019-04-01 ENCOUNTER — Ambulatory Visit: Payer: Medicare Other | Admitting: Pulmonary Disease

## 2019-04-01 LAB — ACID FAST CULTURE WITH REFLEXED SENSITIVITIES (MYCOBACTERIA): Acid Fast Culture: NEGATIVE

## 2019-04-01 NOTE — Progress Notes (Signed)
Virtual Visit via Telephone Note   This visit type was conducted due to national recommendations for restrictions regarding the COVID-19 Pandemic (e.g. social distancing) in an effort to limit this patient's exposure and mitigate transmission in our community.  Due to his co-morbid illnesses, this patient is at least at moderate risk for complications without adequate follow up.  This format is felt to be most appropriate for this patient at this time.  The patient did not have access to video technology/had technical difficulties with video requiring transitioning to audio format only (telephone).  All issues noted in this document were discussed and addressed.  No physical exam could be performed with this format.  Please refer to the patient's chart for his  consent to telehealth for [facility].   Evaluation Performed:  Follow-up visit  Date:  04/02/2019   ID:  Nicholas Cruz, DOB Dec 10, 1931, MRN [id number]  Patient Location: Home  Provider Location: Home  PCP:  Josetta Huddle, MD  Cardiologist:  Larae Grooms, MD   Electrophysiologist:  None   Chief Complaint:  Follow up  History of Present Illness:    Nicholas Cruz is a 85 y.o. male with history of chronic atrial fibrillation on Eliquis, hypertension and hyperlipidemia.  Saw Dr. Irish Lack 01/17/2019 at which time heart rate was 120 bpm and blood pressure elevated.  He increased his diltiazem to 300 mg daily.  Patient was having trouble with pleural effusions that have been tapped and he felt that the higher heart rate could lead to more diastolic dysfunction and pleural effusions and what his heart rate slowed.  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 2D echo in 12/2018 showed dilated atria related to his atrial fibrillation normal LVEF.   I saw  the patient in follow-up 02/11/2019 because heart rate was up when he saw Dr. Loanne Drilling.  Patient admitted to taking cough syrup every night that could be contributing to his faster heart rate.  He was having chronic dyspnea on exertion but had repeat thoracentesis coming up.  I increased his diltiazem to 360 mg daily and placed on monitor on him which showed atrial fibrillation flutter rate controlled.  Patient says he's doing well. No fast HR. Has chronic dyspnea on exertion but is doing the best he's done in a long time  The patient does not have symptoms concerning for COVID-19 infection (fever, chills, cough, or new shortness of breath).    Past Medical History:  Diagnosis Date  . Atrial fibrillation ([place]) 2008  . Colorectal polyps 1985  . Duodenal ulcer 1968  . H/O knee surgery 1990  . History of pulmonary embolism 2007  . Hyperlipidemia   . Hypertension    Past Surgical History:  Procedure Laterality Date  . IR THORACENTESIS ASP PLEURAL SPACE W/IMG GUIDE  12/30/2018  . IR THORACENTESIS ASP PLEURAL SPACE W/IMG GUIDE  02/17/2019  . KNEE SURGERY  1990   [facility]     Current Meds  Medication Sig  . alendronate (FOSAMAX) 70 MG tablet Take 1 tablet by mouth once a week.  Marland Kitchen apixaban (ELIQUIS) 5 MG TABS tablet Take 1 tablet (5 mg total) by mouth 2 (two) times daily.  . digoxin (LANOXIN) 0.125 MG tablet Take 1 tablet (125 mcg total) by mouth daily.  Marland Kitchen diltiazem (CARDIZEM CD) 360 MG 24 hr capsule Take 1 capsule (360 mg  total) by mouth daily.  . irbesartan-hydrochlorothiazide (AVALIDE) 150-12.5 MG tablet Take 1 tablet by mouth every day  . mometasone (NASONEX) 50 MCG/ACT nasal spray Place 2 sprays into the nose daily as needed.   . Omega-3 Fatty Acids (FISH OIL) 1000 MG CAPS Take 1,000 mg by mouth 2 (two) times daily.  . pantoprazole (PROTONIX) 40 MG tablet Take 1 tablet by mouth every day  . potassium chloride SA (K-DUR,KLOR-CON) 20 MEQ tablet Take 1 tablet by mouth every day  . pravastatin  (PRAVACHOL) 80 MG tablet Take 1 tablet by mouth every day  . tamsulosin (FLOMAX) 0.4 MG CAPS capsule TAKE 1 CAPSULE BY MOUTH  DAILY.     Allergies:   Pradaxa [dabigatran etexilate mesylate]   Social History   Tobacco Use  . Smoking status: Former Smoker    Packs/day: 2.00    Years: 12.00    Pack years: 24.00    Types: Cigarettes    Last attempt to quit: 12/18/1978    Years since quitting: 40.3  . Smokeless tobacco: Never Used  Substance Use Topics  . Alcohol use: No  . Drug use: No     Family Hx: The patient's family history includes Cancer - Lung in his brother; Heart attack in his father.  ROS:   Please see the history of present illness.     All other systems reviewed and are negative.   Prior CV studies:   The following studies were reviewed today:   Monitor 3/2020Persistent atrial fibrillation, predominantly rate controlled.  Occasional atrial flutter as well, rate controlled.   2D echo with bubble study 1/16/2020Study Conclusions   - Left ventricle: The cavity size was normal. There was mild   concentric hypertrophy. Systolic function was normal. The   estimated ejection fraction was in the range of 60% to 65%. Wall   motion was normal; there were no regional wall motion   abnormalities. - Aortic valve: Transvalvular velocity was within the normal range.   There was no stenosis. There was mild regurgitation. - Mitral valve: Transvalvular velocity was within the normal range.   There was no evidence for stenosis. There was mild regurgitation. - Left atrium: The atrium was severely dilated. - Right ventricle: The cavity size was normal. Wall thickness was   normal. Systolic function was normal. - Right atrium: The atrium was mildly dilated. - Atrial septum: No defect or patent foramen ovale was identified   by color flow Doppler or saline microcavitation study. - Tricuspid valve: There was mild regurgitation. - Pulmonary arteries: Systolic pressure was  within the normal   range. PA peak pressure: 31 mm Hg (S).         Labs/Other Tests and Data Reviewed:    EKG:  No ECG reviewed.  Recent Labs: 02/10/2019: ALT 10; BUN 22; Creatinine, Ser 1.24; Hemoglobin 14.9; Platelets 219.0; Potassium 4.7; Sodium 137   Recent Lipid Panel Lab Results  Component Value Date/Time   CHOL 180 03/28/2016 07:28 AM   TRIG 179 (H) 03/28/2016 07:28 AM   HDL 37 (L) 03/28/2016 07:28 AM   CHOLHDL 4.9 03/28/2016 07:28 AM   LDLCALC 107 03/28/2016 07:28 AM   LDLDIRECT 86.0 05/13/2015 07:39 AM    Wt Readings from Last 3 Encounters:  04/02/19 170 lb (77.1 kg)  02/11/19 169 lb 6.4 oz (76.8 kg)  02/10/19 168 lb 5.8 oz (76.4 kg)     Objective:    Vital Signs:  Ht 5\' 7"  (1.702 m)   Wt 170 lb (  77.1 kg)   BMI 26.63 kg/m  BP     ASSESSMENT & PLAN:    1. Persistent atrial fibrillation with recent elevated rates.  I increased his diltiazem to 360 mg daily and placed a monitor which showed good heart rate control.  He had been taking a cough medicine that could have been contributing to his tachycardia. Doing well without palpitations.No bleeding problems on eliquis. Crt 1.24 02/10/19  Essential hypertension having trouble with his BP cuff but will start to keep track of it.  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   The importance of social distancing was discussed today.  Time:   Today, I have spent 12:14 minutes with the patient with telehealth technology discussing the above problems.     Medication Adjustments/Labs and Tests Ordered: Current medicines are reviewed at length with the patient today.  Concerns regarding medicines are outlined above.   Tests Ordered: No orders of the defined types  were placed in this encounter.   Medication Changes: No orders of the defined types were placed in this encounter.   Disposition:  Follow up in 6 month(s) Dr. Irish Lack  Signed, [name], PA-C  04/02/2019 11:16 AM    [place] [facility]

## 2019-04-02 ENCOUNTER — Telehealth (INDEPENDENT_AMBULATORY_CARE_PROVIDER_SITE_OTHER): Payer: Medicare Other | Admitting: Physician Assistant

## 2019-04-02 ENCOUNTER — Other Ambulatory Visit: Payer: Self-pay

## 2019-04-02 ENCOUNTER — Encounter: Payer: Self-pay | Admitting: Physician Assistant

## 2019-04-02 VITALS — Ht 67.0 in | Wt 170.0 lb

## 2019-04-02 DIAGNOSIS — I898 Other specified noninfective disorders of lymphatic vessels and lymph nodes: Secondary | ICD-10-CM

## 2019-04-02 DIAGNOSIS — J940 Chylous effusion: Secondary | ICD-10-CM

## 2019-04-02 DIAGNOSIS — E782 Mixed hyperlipidemia: Secondary | ICD-10-CM

## 2019-04-02 DIAGNOSIS — I4811 Longstanding persistent atrial fibrillation: Secondary | ICD-10-CM | POA: Diagnosis not present

## 2019-04-02 DIAGNOSIS — I10 Essential (primary) hypertension: Secondary | ICD-10-CM

## 2019-04-02 NOTE — Patient Instructions (Signed)

## 2019-04-11 ENCOUNTER — Telehealth: Payer: Self-pay

## 2019-04-11 NOTE — Telephone Encounter (Signed)
Called pt to set up possible evisit, unable to leave message.

## 2019-04-14 NOTE — Telephone Encounter (Signed)
Patient had televisit with [name], PA on 4/15.

## 2019-04-16 ENCOUNTER — Ambulatory Visit: Payer: Medicare Other | Admitting: Interventional Cardiology

## 2019-06-03 ENCOUNTER — Other Ambulatory Visit: Payer: Self-pay | Admitting: Physician Assistant

## 2019-06-03 MED ORDER — DILTIAZEM HCL ER COATED BEADS 360 MG PO CP24: 360.0000 mg | ORAL_CAPSULE | Freq: Every day | ORAL | 3 refills | Status: DC

## 2019-07-15 ENCOUNTER — Ambulatory Visit: Payer: Medicare Other | Admitting: Pulmonary Disease

## 2019-07-15 ENCOUNTER — Ambulatory Visit (INDEPENDENT_AMBULATORY_CARE_PROVIDER_SITE_OTHER): Payer: Medicare Other

## 2019-07-15 ENCOUNTER — Encounter: Payer: Self-pay | Admitting: Pulmonary Disease

## 2019-07-15 ENCOUNTER — Other Ambulatory Visit: Payer: Self-pay

## 2019-07-15 VITALS — BP 144/60 | HR 80 | Temp 98.1°F | Ht 66.0 in | Wt 166.0 lb

## 2019-07-15 DIAGNOSIS — R0602 Shortness of breath: Secondary | ICD-10-CM

## 2019-07-15 DIAGNOSIS — I898 Other specified noninfective disorders of lymphatic vessels and lymph nodes: Secondary | ICD-10-CM | POA: Diagnosis not present

## 2019-07-15 DIAGNOSIS — J940 Chylous effusion: Secondary | ICD-10-CM

## 2019-07-15 NOTE — Patient Instructions (Addendum)
CXR today Our office will call you with the results later today and plan for thoracentesis if needed

## 2019-07-15 NOTE — Progress Notes (Signed)
Subjective:   PATIENT ID: Nicholas Cruz GENDER: male DOB: December 14, 1931, MRN: [id number]   HPI  Chief Complaint  Patient presents with  . Follow-up    no worse no better since last visit-Shortness of breath no wheezing, no congestion   [name] is a 85 year old male with atrial fibrillation on Eliquis, hypertension and HLD who presents for follow-up of right chylothorax  He was initially seen in clinic with me on 12/26/2018 for right pleural effusion seen on chest imaging since 11/21/18. Underwent thoracentesis on 1/13 with 1.3L of milky fluid drained. Fluid demonstrated predominantly lymphocytic with trig 1920, chol 82, glu 143, LDH 73. Cultures and cytology neg. He initially felt relief post-procedure however felt his dyspnea returned and unchanged since the last time we visited.  Interval He was last seen on 02/10/19. He underwent repeat thoracentesis for recurrent chylothorax on 3/2 with pan imaging ordered post-procedure. No evidence of malignancy, thoracic duct leakage or trauma was seen. Patient was started on high-protein and low-fat diet with plan to return to clinic and repeat CT Chest. Unfortunately, patient was lost to follow-up due to limitations related to the pandemic. He was rescheduled and sees me in clinic today for his chylothorax.  He reports shortness of breath that is unchanged and no worse than his previous baseline. His dyspnea does not limit his activity and he is able to do tasks including mowing his yard. Dyspnea worsens with heat and heavy exertion. Denies orthopnea. Denies wheezes, fevers, chills, chest pain. Does report unintentional weight loss of 12 lbs in the last 2-3 months .   He has multiple stressors at home. He is the primary caregiver for his wife who has worsening memory loss. His daughter is helping with grocery shopping for social distancing.   Social History: Former smoker. Quit in 1980. 24 pack-years. No hx of thoracic surgery, radiation or  any procedures except for thoracentesis  Environmental exposures: No known chemical, raw material or environmental exposures.  I have personally reviewed patient's past medical/family/social history/allergies/current medications.  Past Medical History:  Diagnosis Date  . Atrial fibrillation ([place]) 2008  . Colorectal polyps 1985  . Duodenal ulcer 1968  . H/O knee surgery 1990  . History of pulmonary embolism 2007  . Hyperlipidemia   . Hypertension      Family History  Problem Relation Age of Onset  . Heart attack Father   . Cancer - Lung Brother      Social History   Occupational History  . Occupation: retired  Tobacco Use  . Smoking status: Former Smoker    Packs/day: 2.00    Years: 31.00    Pack years: 62.00    Types: Cigarettes    Start date: 1949    Quit date: 12/18/1978    Years since quitting: 40.6  . Smokeless tobacco: Never Used  Substance and Sexual Activity  . Alcohol use: No  . Drug use: No  . Sexual activity: Not Currently    Partners: Female    Allergies  Allergen Reactions  . Pradaxa [Dabigatran Etexilate Mesylate] Other (See Comments)    Hemoptysis     Outpatient Medications Prior to Visit  Medication Sig Dispense Refill  . alendronate (FOSAMAX) 70 MG tablet Take 1 tablet by mouth once a week.    Marland Kitchen apixaban (ELIQUIS) 5 MG TABS tablet Take 1 tablet (5 mg total) by mouth 2 (two) times daily. 180 tablet 2  . digoxin (LANOXIN) 0.125 MG tablet Take 1 tablet (125  mcg total) by mouth daily. 90 tablet 3  . diltiazem (CARDIZEM CD) 360 MG 24 hr capsule Take 1 capsule (360 mg total) by mouth daily. 90 capsule 3  . irbesartan-hydrochlorothiazide (AVALIDE) 150-12.5 MG tablet Take 1 tablet by mouth every day 60 tablet 0  . mometasone (NASONEX) 50 MCG/ACT nasal spray Place 2 sprays into the nose daily as needed.     . Omega-3 Fatty Acids (FISH OIL) 1000 MG CAPS Take 1,000 mg by mouth 2 (two) times daily.    . pantoprazole (PROTONIX) 40 MG tablet Take 1 tablet by  mouth every day 60 tablet 0  . potassium chloride SA (K-DUR,KLOR-CON) 20 MEQ tablet Take 1 tablet by mouth every day 60 tablet 0  . pravastatin (PRAVACHOL) 80 MG tablet Take 1 tablet by mouth every day 60 tablet 0  . tamsulosin (FLOMAX) 0.4 MG CAPS capsule TAKE 1 CAPSULE BY MOUTH  DAILY. 90 capsule 1   No facility-administered medications prior to visit.     Review of Systems  Constitutional: Negative for chills, diaphoresis, fever, malaise/fatigue and weight loss.  HENT: Negative for congestion, ear pain and sore throat.   Respiratory: Positive for shortness of breath. Negative for cough, hemoptysis, sputum production and wheezing.   Cardiovascular: Negative for chest pain, palpitations and leg swelling.  Gastrointestinal: Negative for abdominal pain, heartburn and nausea.  Genitourinary: Negative for frequency.  Musculoskeletal: Negative for joint pain and myalgias.  Skin: Negative for itching and rash.  Neurological: Negative for dizziness, weakness and headaches.  Endo/Heme/Allergies: Does not bruise/bleed easily.  Psychiatric/Behavioral: Negative for depression. The patient is not nervous/anxious.      Objective:   Vitals:   07/15/19 1114  BP: (!) 144/60  Pulse: 80  Temp: 98.1 F (36.7 C)  TempSrc: Oral  SpO2: 98%  Weight: 166 lb (75.3 kg)  Height: 5\' 6"  (1.676 m)   SpO2: 98 % O2 Device: None (Room air)  Physical Exam: General: Well-appearing, no acute distress HENT: [place], AT Eyes: EOMI, no scleral icterus Respiratory: Absent right-sided breath sounds Cardiovascular: RRR, -M/R/G, no JVD GI: BS+, soft, nontender Extremities:-Edema,-tenderness Neuro: AAO x4, CNII-XII grossly intact Skin: Intact, no rashes or bruising Psych: Normal mood, normal affect  Chest imaging: CXR 02/10/19 - Recurrent small/moderate right pleural effusion CT CAP 02/17/19 - Right pleural effusion, borderline thoracic lymphadenopathy, LUL nodularity concerning for infection, unable to visualize  prior RLL pulmonary nodules CXR 07/15/19 - Moderate right pleural effusion  PFT: None on file  Pertinent Labs: Thoracentesis 02/17/19 Cell count- white, turbid fluid with WBC 2170, 88% lymphocytic, 11% monocytic, 1% eos Cytology - numerous lymphocytes, no malignancy AFB and fungal cultures neg Pleural Trig 1194 Pleural Chol 79 Pleural LDH 123 Pleural amylase 39 Pleural glucose 100  Imaging, labs and test noted above have been reviewed independently by me.    Assessment & Plan:   Chylothorax  Discussion: 85 year old male former smoker with atrial fibrillation on Eliquis, hx of unprovoked PE in 2007 who initially presented with right pleural effusion found to be chylothorax s/p thoracentesis on 12/30/18 and 02/17/19. Etiology of chylothorax unknown however malignancy is high on the differential. No hx of thoracic surgery, significant trauma or malignancy. Reviewed CXR 07/15/19 which demonstrates recurrent effusion likely to represent chylothorax again. Given the time interval since last imaging and patient report of recent unintentional weight loss, will plan to repeat thoracentesis followed by pan-imaging. If mediastinal lymphadenopathy persistent will consider bronchoscopy.  Plan: Will arrange for thoracentesis followed CT Chest Abdomen Pelvis  with and without contrast on same day. Patient will need to hold anticoagulation prior to procedure. Pending results will consider bronchoscopy. If work-up remains negative, will pursue lymphangiogram Recommend continuing high-protein and low-fat diet  Orders Placed This Encounter  Procedures  . DG Chest 2 View    Standing Status:   Future    Number of Occurrences:   1    Standing Expiration Date:   09/14/2020    Order Specific Question:   Reason for Exam (SYMPTOM  OR DIAGNOSIS REQUIRED)    Answer:   shortness of breath    Order Specific Question:   Preferred imaging location?    Answer:   [facility]    Order Specific Question:    Radiology Contrast Protocol - do NOT remove file path    Answer:   \\[email or web address]  No orders of the defined types were placed in this encounter.   Return if symptoms worsen or fail to improve.  Chi Rodman Pickle, MD [facility] 07/15/2019 11:20 AM  Personal pager: [phone] If unanswered, please page CCM On-call: [phone]

## 2019-07-22 NOTE — Addendum Note (Signed)
Addended by: Amado Coe on: 07/22/2019 05:06 PM   Modules accepted: Orders

## 2019-07-22 NOTE — Progress Notes (Signed)
Would like labs completed on the fluid? If so, which labs. Thanks!

## 2019-07-23 ENCOUNTER — Other Ambulatory Visit: Payer: Self-pay | Admitting: Pulmonary Disease

## 2019-07-23 DIAGNOSIS — N182 Chronic kidney disease, stage 2 (mild): Secondary | ICD-10-CM

## 2019-07-23 NOTE — Addendum Note (Signed)
Addended by: Parke Poisson E on: 07/23/2019 12:53 PM   Modules accepted: Orders

## 2019-07-24 ENCOUNTER — Other Ambulatory Visit (INDEPENDENT_AMBULATORY_CARE_PROVIDER_SITE_OTHER): Payer: Medicare Other

## 2019-07-24 ENCOUNTER — Other Ambulatory Visit (HOSPITAL_COMMUNITY)
Admission: RE | Admit: 2019-07-24 | Discharge: 2019-07-24 | Disposition: A | Discharge status: Home / Self Care | Payer: Medicare Other | Source: Ambulatory Visit | Attending: Pulmonary Disease | Admitting: Pulmonary Disease

## 2019-07-24 DIAGNOSIS — N182 Chronic kidney disease, stage 2 (mild): Secondary | ICD-10-CM

## 2019-07-24 DIAGNOSIS — J90 Pleural effusion, not elsewhere classified: Secondary | ICD-10-CM | POA: Diagnosis not present

## 2019-07-24 DIAGNOSIS — E785 Hyperlipidemia, unspecified: Secondary | ICD-10-CM | POA: Diagnosis not present

## 2019-07-24 DIAGNOSIS — I4891 Unspecified atrial fibrillation: Secondary | ICD-10-CM | POA: Diagnosis not present

## 2019-07-24 DIAGNOSIS — Z1159 Encounter for screening for other viral diseases: Secondary | ICD-10-CM | POA: Diagnosis not present

## 2019-07-24 DIAGNOSIS — Z79899 Other long term (current) drug therapy: Secondary | ICD-10-CM | POA: Diagnosis not present

## 2019-07-24 DIAGNOSIS — J984 Other disorders of lung: Secondary | ICD-10-CM | POA: Diagnosis not present

## 2019-07-24 DIAGNOSIS — I700 Atherosclerosis of aorta: Secondary | ICD-10-CM | POA: Diagnosis not present

## 2019-07-24 DIAGNOSIS — Z87891 Personal history of nicotine dependence: Secondary | ICD-10-CM | POA: Diagnosis not present

## 2019-07-24 DIAGNOSIS — I10 Essential (primary) hypertension: Secondary | ICD-10-CM | POA: Diagnosis not present

## 2019-07-24 DIAGNOSIS — I898 Other specified noninfective disorders of lymphatic vessels and lymph nodes: Secondary | ICD-10-CM | POA: Diagnosis present

## 2019-07-24 DIAGNOSIS — Z7901 Long term (current) use of anticoagulants: Secondary | ICD-10-CM | POA: Diagnosis not present

## 2019-07-24 LAB — SARS CORONAVIRUS 2 (TAT 6-24 HRS): SARS Coronavirus 2: NEGATIVE

## 2019-07-25 LAB — BUN: BUN: 24 mg/dL — ABNORMAL HIGH (ref 6–23)

## 2019-07-25 LAB — CREATININE, SERUM: Creatinine, Ser: 1.22 mg/dL (ref 0.40–1.50)

## 2019-07-26 ENCOUNTER — Other Ambulatory Visit: Payer: Self-pay | Admitting: Interventional Cardiology

## 2019-07-28 ENCOUNTER — Ambulatory Visit (HOSPITAL_COMMUNITY)
Admission: RE | Admit: 2019-07-28 | Discharge: 2019-07-28 | Disposition: A | Discharge status: Home / Self Care | Payer: Medicare Other | Source: Ambulatory Visit | Attending: Pulmonary Disease | Admitting: Pulmonary Disease

## 2019-07-28 ENCOUNTER — Encounter (HOSPITAL_COMMUNITY): Payer: Self-pay | Admitting: Physician Assistant

## 2019-07-28 ENCOUNTER — Other Ambulatory Visit: Payer: Self-pay

## 2019-07-28 DIAGNOSIS — J90 Pleural effusion, not elsewhere classified: Secondary | ICD-10-CM | POA: Insufficient documentation

## 2019-07-28 DIAGNOSIS — I700 Atherosclerosis of aorta: Secondary | ICD-10-CM | POA: Insufficient documentation

## 2019-07-28 DIAGNOSIS — Z1159 Encounter for screening for other viral diseases: Secondary | ICD-10-CM | POA: Insufficient documentation

## 2019-07-28 DIAGNOSIS — I10 Essential (primary) hypertension: Secondary | ICD-10-CM | POA: Insufficient documentation

## 2019-07-28 DIAGNOSIS — Z7901 Long term (current) use of anticoagulants: Secondary | ICD-10-CM | POA: Insufficient documentation

## 2019-07-28 DIAGNOSIS — I898 Other specified noninfective disorders of lymphatic vessels and lymph nodes: Secondary | ICD-10-CM

## 2019-07-28 DIAGNOSIS — J940 Chylous effusion: Secondary | ICD-10-CM

## 2019-07-28 DIAGNOSIS — J984 Other disorders of lung: Secondary | ICD-10-CM | POA: Insufficient documentation

## 2019-07-28 DIAGNOSIS — Z87891 Personal history of nicotine dependence: Secondary | ICD-10-CM | POA: Insufficient documentation

## 2019-07-28 DIAGNOSIS — Z79899 Other long term (current) drug therapy: Secondary | ICD-10-CM | POA: Insufficient documentation

## 2019-07-28 DIAGNOSIS — E785 Hyperlipidemia, unspecified: Secondary | ICD-10-CM | POA: Insufficient documentation

## 2019-07-28 DIAGNOSIS — I4891 Unspecified atrial fibrillation: Secondary | ICD-10-CM | POA: Insufficient documentation

## 2019-07-28 HISTORY — PX: IR THORACENTESIS ASP PLEURAL SPACE W/IMG GUIDE: IMG5380

## 2019-07-28 LAB — PROTEIN, PLEURAL OR PERITONEAL FLUID

## 2019-07-28 LAB — AMYLASE, PLEURAL OR PERITONEAL FLUID: Amylase, Fluid: 25 U/L

## 2019-07-28 LAB — GLUCOSE, PLEURAL OR PERITONEAL FLUID: Glucose, Fluid: 101 mg/dL

## 2019-07-28 LAB — BODY FLUID CELL COUNT WITH DIFFERENTIAL
Eos, Fluid: 0 %
Lymphs, Fluid: 97 %
Monocyte-Macrophage-Serous Fluid: 1 % — ABNORMAL LOW (ref 50–90)
Neutrophil Count, Fluid: 2 % (ref 0–25)
Total Nucleated Cell Count, Fluid: 249 cu mm (ref 0–1000)

## 2019-07-28 LAB — LACTATE DEHYDROGENASE, PLEURAL OR PERITONEAL FLUID: LD, Fluid: 140 U/L — ABNORMAL HIGH (ref 3–23)

## 2019-07-28 LAB — ACID FAST SMEAR (AFB, MYCOBACTERIA): Acid Fast Smear: NEGATIVE

## 2019-07-28 MED ORDER — LIDOCAINE HCL 1 % IJ SOLN
INTRAMUSCULAR | Status: DC | PRN
  Administered 2019-07-28: 10 mL

## 2019-07-28 MED ORDER — IOHEXOL 300 MG/ML  SOLN
100.0000 mL | Freq: Once | INTRAMUSCULAR | Status: AC | PRN
  Administered 2019-07-28: 100 mL via INTRAVENOUS

## 2019-07-28 MED ORDER — LIDOCAINE HCL 1 % IJ SOLN
INTRAMUSCULAR | Status: AC
  Filled 2019-07-28: qty 20

## 2019-07-28 NOTE — Procedures (Signed)
PROCEDURE SUMMARY:  Successful image-guided right thoracentesis. Yielded 220 milliliters of chylous fluid. Patient tolerated procedure well. EBL: Zero No immediate complications.  Specimen was sent for labs. Patient scheduled for CT chest/abdomen/pelvis following procedure - will review CT for post procedure pneumothorax in lieu of CXR.  Please see imaging section of Epic for full dictation.  Joaquim Nam PA-C 07/28/2019 9:37 AM

## 2019-07-28 NOTE — Telephone Encounter (Signed)
Age 85, weight 75kg, SCr 1.22 on 07/24/19, afib indication, last OV 03/2019

## 2019-07-29 ENCOUNTER — Other Ambulatory Visit (HOSPITAL_COMMUNITY): Payer: Medicare Other

## 2019-07-29 LAB — TRIGLYCERIDES, BODY FLUIDS: Triglycerides, Fluid: 1020 mg/dL

## 2019-07-30 ENCOUNTER — Telehealth: Payer: Self-pay | Admitting: Pulmonary Disease

## 2019-07-30 NOTE — Telephone Encounter (Signed)
  Notes recorded by Margaretha Seeds, MD on 07/16/2019 at 9:16 PM EDT  CXR reviewed. Chylothorax has returned. He will likely need to arrange thoracentesis followed by lymphangiogram. Please contact patient regarding results and tentative plan. Our office will call to arrange and let patient know appropriate timing to hold anticoagulation and date of procedure.   Called and spoke to pt and relayed above results. Pt stated that he had thoracentesis on 8/10/202. Pt stated during his CT, it was mentioned that fluid was coming from his chest. He was advised that this area may need to be plugged. Pt is requesting additional information regarding this.  Dr. Loanne Drilling please advise. Thanks

## 2019-07-31 ENCOUNTER — Telehealth: Payer: Self-pay | Admitting: Pulmonary Disease

## 2019-07-31 LAB — BODY FLUID CULTURE: Culture: NO GROWTH

## 2019-07-31 LAB — CHOLESTEROL, BODY FLUID: Cholesterol, Fluid: 61 mg/dL

## 2019-07-31 NOTE — Telephone Encounter (Signed)
LR - Please call patient to see if he is continuing to have leakage. Also find out when he restarted his blood thinners.  JE

## 2019-07-31 NOTE — Telephone Encounter (Signed)
Called and spoke to patient. He states he says the site is fine. He started his blood thinner Monday afternoon.   Will route to Dr. Loanne Drilling as Juluis Rainier

## 2019-07-31 NOTE — Telephone Encounter (Signed)
[facility] Telephone Encounter  Reviewed CT CAP imaging and labs which remain consistent for recurrent chylothorax, though flow may have slowed down with only >200cc drained on 8/10. Unchanged small pulmonary nodules with no evidence of mediastinal or hilar adenopathy. Chronic RLL airway changes with mild bronchiectasis, atelectasis and volume loss. Small partially loculated right pleural effusion. Unclear cause of chylothorax.  Findings communicated with patient. Will plan for dietician referral Antonieta Iba, RD) and clinic follow-up in 3 months. Will consider further imaging as needed. Will discuss case with IR regarding utility of imaging for slow leak (eg lymphangiogram, ?MR) for possible ligation.  Will arrange follow-up with me in 3 months.  Rodman Pickle, M.D. [facility] 07/31/2019 4:22 PM

## 2019-08-04 ENCOUNTER — Other Ambulatory Visit: Payer: Self-pay | Admitting: Pulmonary Disease

## 2019-08-04 DIAGNOSIS — J940 Chylous effusion: Secondary | ICD-10-CM

## 2019-08-04 DIAGNOSIS — I898 Other specified noninfective disorders of lymphatic vessels and lymph nodes: Secondary | ICD-10-CM

## 2019-08-05 ENCOUNTER — Telehealth: Payer: Self-pay | Admitting: Dietician

## 2019-08-05 NOTE — Telephone Encounter (Signed)
Re:  Nutrition appointment needed for chylothorax Called patient his am and this pm and unable to reach him.  Left a message for him to call our office.  Discussed with our front office to contact me when they hear from him and I will work him into my schedule. I will continue to try to contact him over the next couple days as well.  Antonieta Iba, RD, LDN, CDE

## 2019-08-07 ENCOUNTER — Telehealth: Payer: Self-pay | Admitting: Dietician

## 2019-08-07 NOTE — Telephone Encounter (Signed)
Brief Nutrition Note  Called patient several times due to referral for chylothorax. Was finally able to speak with Nicholas Cruz and discussed that he was hard to reach.  He stated that is because he did not want what I had to offer and that he discussed this with the MD. He stated that he did not want to come into the office. I responded that we do tele visits currently and we could just speak over the phone.  He stated he was fine and he had lost 20 lbs. Discussed that was some of the concern that we wanted to make sure he did not lose too much weight and wanted the chylothorax to improve.  He said that he was fine and did not need to speak with me.  He stated that his daughter brings him dinner every night and keeps up with his diet.   He again refused a consult. He stated he will call if needed.  Antonieta Iba, RD, LDN, CDE

## 2019-08-07 NOTE — Telephone Encounter (Signed)
Mickel Baas,   Mr. Edmonds had previously agreed to the referral but I truly appreciate your time in contacting him. I will continue to encourage a high protein and low fat diet. If Mr. Soward changes his mind, I will contact you.  Rodman Pickle, MD

## 2019-08-27 LAB — FUNGUS CULTURE RESULT

## 2019-08-27 LAB — FUNGUS CULTURE WITH STAIN

## 2019-08-27 LAB — FUNGAL ORGANISM REFLEX

## 2019-09-09 LAB — ACID FAST CULTURE WITH REFLEXED SENSITIVITIES (MYCOBACTERIA): Acid Fast Culture: NEGATIVE

## 2019-10-21 ENCOUNTER — Ambulatory Visit: Payer: Medicare Other | Admitting: Pulmonary Disease

## 2019-10-21 ENCOUNTER — Encounter: Payer: Self-pay | Admitting: Pulmonary Disease

## 2019-10-21 ENCOUNTER — Other Ambulatory Visit: Payer: Self-pay

## 2019-10-21 VITALS — BP 158/56 | HR 84 | Temp 98.5°F | Ht 67.0 in | Wt 166.4 lb

## 2019-10-21 DIAGNOSIS — I898 Other specified noninfective disorders of lymphatic vessels and lymph nodes: Secondary | ICD-10-CM | POA: Diagnosis not present

## 2019-10-21 DIAGNOSIS — J940 Chylous effusion: Secondary | ICD-10-CM

## 2019-10-21 NOTE — Patient Instructions (Addendum)
Right pleural effusion Will arrange for thoracentesis followed CT Chest Abdomen Pelvis with and without contrast on same day. Patient will need to hold anticoagulation prior to procedure.  We will contact your regarding details of procedure

## 2019-10-21 NOTE — Progress Notes (Signed)
Subjective:   PATIENT ID: [name] GENDER: male DOB: [birth year], MRN: [id number]   HPI  Chief Complaint  Patient presents with  . Follow-up    f/u Chylothoarx   Mr. Lucian Schuenke is a 85 year-old male with atrial fibrillation on Eliquis, HTN and HLD who presents for follow-up of right chylothorax.  He was initially seen in clinic with me on 12/26/2018 for right pleural effusion seen on chest imaging since 11/21/18. Underwent thoracentesis on 1/13 with 1.3L of milky fluid drained. Fluid demonstrated predominantly lymphocytic with trig 1920, chol 82, glu 143, LDH 73. Cultures and cytology neg. He initially felt relief post-procedure however felt his dyspnea returned and unchanged since the last time we visited.  Interval 07/15/19 He was last seen on 02/10/19. He underwent repeat thoracentesis for recurrent chylothorax on 3/2 with pan imaging ordered post-procedure. No evidence of malignancy, thoracic duct leakage or trauma was seen. Patient was started on high-protein and low-fat diet with plan to return to clinic and repeat CT Chest. Unfortunately, patient was lost to follow-up due to limitations related to the pandemic. He was rescheduled and sees me in clinic today for his chylothorax.  He reports shortness of breath that is unchanged and no worse than his previous baseline. His dyspnea does not limit his activity and he is able to do tasks including mowing his yard. Dyspnea worsens with heat and heavy exertion. Denies orthopnea. Denies wheezes, fevers, chills, chest pain. Does report unintentional weight loss of 12 lbs in the last 2-3 months .   He has multiple stressors at home. He is the primary caregiver for his wife who has worsening memory loss. His daughter is helping with grocery shopping for social distancing.   Interval History 11/3 Since our last visit, he has been working with his daughter in complying with a low-fat diet. He reports that from a breathing standpoint, this is  the best that he has felt and denies shortness of breath or chest pain. Denies weight loss, night sweats, or unexplained fevers or chills.  Social History: Former smoker. Quit in 1980. 24 pack-years. No hx of thoracic surgery, radiation or any procedures except for thoracentesis  I have personally reviewed patient's past medical/family/social history/allergies/current medications.  Past Medical History:  Diagnosis Date  . Atrial fibrillation ([place]) 2008  . Colorectal polyps 1985  . Duodenal ulcer 1968  . H/O knee surgery 1990  . History of pulmonary embolism 2007  . Hyperlipidemia   . Hypertension      Family History  Problem Relation Age of Onset  . Heart attack Father   . Cancer - Lung Brother      Social History   Occupational History  . Occupation: retired  Tobacco Use  . Smoking status: Former Smoker    Packs/day: 2.00    Years: 31.00    Pack years: 62.00    Types: Cigarettes    Start date: 1949    Quit date: 12/18/1978    Years since quitting: 40.8  . Smokeless tobacco: Never Used  Substance and Sexual Activity  . Alcohol use: No  . Drug use: No  . Sexual activity: Not Currently    Partners: Female    Allergies  Allergen Reactions  . Pradaxa [Dabigatran Etexilate Mesylate] Other (See Comments)    Hemoptysis     Outpatient Medications Prior to Visit  Medication Sig Dispense Refill  . alendronate (FOSAMAX) 70 MG tablet Take 1 tablet by mouth once a week.    Marland Kitchen  digoxin (LANOXIN) 0.125 MG tablet Take 1 tablet (125 mcg total) by mouth daily. 90 tablet 3  . diltiazem (CARDIZEM CD) 360 MG 24 hr capsule Take 1 capsule (360 mg total) by mouth daily. 90 capsule 3  . ELIQUIS 5 MG TABS tablet TAKE 1 TABLET BY MOUTH TWO  TIMES DAILY 180 tablet 1  . irbesartan-hydrochlorothiazide (AVALIDE) 150-12.5 MG tablet Take 1 tablet by mouth every day 60 tablet 0  . mometasone (NASONEX) 50 MCG/ACT nasal spray Place 2 sprays into the nose daily as needed.     . Omega-3 Fatty Acids  (FISH OIL) 1000 MG CAPS Take 1,000 mg by mouth 2 (two) times daily.    . pantoprazole (PROTONIX) 40 MG tablet Take 1 tablet by mouth every day 60 tablet 0  . potassium chloride SA (K-DUR,KLOR-CON) 20 MEQ tablet Take 1 tablet by mouth every day 60 tablet 0  . pravastatin (PRAVACHOL) 80 MG tablet Take 1 tablet by mouth every day 60 tablet 0  . tamsulosin (FLOMAX) 0.4 MG CAPS capsule TAKE 1 CAPSULE BY MOUTH  DAILY. 90 capsule 1   No facility-administered medications prior to visit.     Review of Systems  Constitutional: Negative for chills, diaphoresis, fever, malaise/fatigue and weight loss.  HENT: Negative for congestion.   Respiratory: Negative for cough, hemoptysis, sputum production, shortness of breath and wheezing.   Cardiovascular: Negative for chest pain, palpitations and leg swelling.    Objective:   Vitals:   10/21/19 1452  BP: (!) 158/56  Pulse: 84  Temp: 98.5 F (36.9 C)  TempSrc: Temporal  SpO2: 96%  Weight: 166 lb 6.4 oz (75.5 kg)  Height: 5\' 7"  (1.702 m)     Physical Exam: General: Well-appearing, no acute distress HENT: [place], AT Eyes: EOMI, no scleral icterus Respiratory: Absent right lower lobe breath sounds Cardiovascular: RRR, -M/R/G, no JVD GI: BS+, soft, nontender Extremities:-Edema,-tenderness Neuro: AAO x4, CNII-XII grossly intact Skin: Intact, no rashes or bruising Psych: Normal mood, normal affect  Data Reviewed  Chest imaging: Bedside US 10/21/19 - right pleural effusion present, uncomplicated CT Chest 0000000 - Unchanged mediastinal lymph nodes. Right loculated pleural effusion CXR 02/10/19 - Recurrent small/moderate right pleural effusion CT CAP 02/17/19 - Right pleural effusion, borderline thoracic lymphadenopathy, LUL nodularity concerning for infection, unable to visualize prior RLL pulmonary nodules CXR 07/15/19 - Moderate right pleural effusion  PFT: None on file  Pleural Labs: Thoracentesis 02/17/19 Cell count- white, turbid fluid with  WBC 2170, 88% lymphocytic, 11% monocytic, 1% eos  Lab Results  Component Value Date/Time   Triglycerides, Fluid 1,020 07/28/2019 0931   Triglycerides, Fluid 1,194 02/17/2019 0933   Triglycerides, Fluid 1,920 12/30/2018 1149   LD, Fluid 140 (H) 07/28/2019 0931   LD, Fluid 123 (H) 02/17/2019 0933   LD, Fluid 73 (H) 12/30/2018 1149   Amylase, Fluid 25 07/28/2019 0931   Amylase, Fluid 39 02/17/2019 0933   Amylase, Fluid 46 12/30/2018 1149   Cholesterol, Fluid 61 07/28/2019 0931   Cholesterol, Fluid 79 02/17/2019 0933   Cholesterol, Fluid 82 12/30/2018 1149   Total protein, fluid RESULTS UNAVAILABLE DUE TO INTERFERING SUBSTANCE 07/28/2019 0931   Total protein, fluid RESULTS UNAVAILABLE DUE TO INTERFERING SUBSTANCE 02/17/2019 0933   Total protein, fluid UNABLE TO DETERMINE DUE TO LIPEMIA 12/30/2018 1149   Glucose, Fluid 101 07/28/2019 0931   Glucose, Fluid 100 02/17/2019 0933   Glucose, Fluid 143 12/30/2018 1149   Cultures and cytology negative  Imaging, labs and test noted above have been reviewed  independently by me.    Assessment & Plan:   Recurrent right chylothorax  Discussion: 85 year old male former smoker with atrial fibrillation on Eliquis and hx of unprovoked PE in 2007 who presents with recurrent right chylothorax. He has underwent thoracentesis on 12/30/18, 02/17/19 and 07/28/19 with negative cultures and cytology. Etiology remains unknown but does seem to improve with diet as his last thora in August 2020 yielded only 220cc of chylous fluid. No hx of thoracic surgery, significant trauma or malignancy. Malignancy remains on the differential however his mediastinal adenopathy as remained stable and does not have any red flags. Mr. Kortan has also expressed deferring major procedures if possible as he is the caregiver for his wife. He is currently asymptomatic right now but given his recurrence of effusion, we discussed repeating thoracentesis vs repeating chest imaging. We also again  discussed bronchoscopy however would like to await chest imaging before arranging this.  Right chylothorax Will obtain CT Chest Abdomen Pelvis with and without contrast   Pending results will consider bronchoscopy If work-up remains negative, could consider pursuing lymphangiogram Recommend continuing high-protein and low-fat diet  No orders of the defined types were placed in this encounter. No orders of the defined types were placed in this encounter.  Return after thoracentesis and CT A/P.  Chi Rodman Pickle, MD [facility] 10/21/2019 9:05 AM  Personal pager: [phone] If unanswered, please page CCM On-call: [phone]

## 2019-10-29 NOTE — Progress Notes (Signed)
Cardiology Office Note   Date:  10/31/2019   ID:  Nicholas Cruz, DOB [birth year], MRN [id number]  PCP:  Josetta Huddle, MD    No chief complaint on file.  AFib  Wt Readings from Last 3 Encounters:  10/31/19 168 lb 6.4 oz (76.4 kg)  10/21/19 166 lb 6.4 oz (75.5 kg)  07/15/19 166 lb (75.3 kg)       History of Present Illness: Nicholas Cruz is a 85 y.o. male  who has had atrial fibrillation. He was on an antiarrhythmic in the past. Now Rate controlled.  Eliquis for stroke prevention.   His wife has Alzheimer's disease- although she does not admit it. He is a primary caretaker for her.THis is a source of stress.  He had an echo in Jan 2020 at Dr. Cordelia Pen office showing dilated atria and was referred back for evaluation.  LV function was normal.   He had some fluid removed from his right lung from VIR.  He feels much better after this procedure. He has had issues with chylothorax.  Since the last, he has had more fluid around his right lung.  Eliquis is expensive for him.    Wife's memory is gone.  She needs supervision.  THis is a big source of stress.  Fell off the porch and had knee swelling.   Denies : Chest pain. Dizziness. Leg edema. Nitroglycerin use. Orthopnea. Palpitations. Paroxysmal nocturnal dyspnea. Shortness of breath. Syncope.   Past Medical History:  Diagnosis Date  . Atrial fibrillation ([place]) 2008  . Colorectal polyps 1985  . Duodenal ulcer 1968  . H/O knee surgery 1990  . History of pulmonary embolism 2007  . Hyperlipidemia   . Hypertension     Past Surgical History:  Procedure Laterality Date  . IR THORACENTESIS ASP PLEURAL SPACE W/IMG GUIDE  12/30/2018  . IR THORACENTESIS ASP PLEURAL SPACE W/IMG GUIDE  02/17/2019  . IR THORACENTESIS ASP PLEURAL SPACE W/IMG GUIDE  07/28/2019  . KNEE SURGERY  1990   [facility]     Current Outpatient Medications  Medication Sig Dispense Refill  . alendronate (FOSAMAX) 70 MG tablet Take 1 tablet by mouth once  a week.    . digoxin (LANOXIN) 0.125 MG tablet Take 1 tablet (125 mcg total) by mouth daily. 90 tablet 3  . diltiazem (CARDIZEM CD) 360 MG 24 hr capsule Take 1 capsule (360 mg total) by mouth daily. 90 capsule 3  . ELIQUIS 5 MG TABS tablet TAKE 1 TABLET BY MOUTH TWO  TIMES DAILY 180 tablet 1  . irbesartan-hydrochlorothiazide (AVALIDE) 150-12.5 MG tablet Take 1 tablet by mouth every day 60 tablet 0  . mometasone (NASONEX) 50 MCG/ACT nasal spray Place 2 sprays into the nose daily as needed.     . Omega-3 Fatty Acids (FISH OIL) 1000 MG CAPS Take 1,000 mg by mouth 2 (two) times daily.    . pantoprazole (PROTONIX) 40 MG tablet Take 1 tablet by mouth every day 60 tablet 0  . potassium chloride SA (K-DUR,KLOR-CON) 20 MEQ tablet Take 1 tablet by mouth every day 60 tablet 0  . pravastatin (PRAVACHOL) 80 MG tablet Take 1 tablet by mouth every day 60 tablet 0  . tamsulosin (FLOMAX) 0.4 MG CAPS capsule TAKE 1 CAPSULE BY MOUTH  DAILY. 90 capsule 1   No current facility-administered medications for this visit.     Allergies:   Pradaxa [dabigatran etexilate mesylate]    Social History:  The patient  reports that he quit  smoking about 40 years ago. His smoking use included cigarettes. He started smoking about 71 years ago. He has a 62.00 pack-year smoking history. He has never used smokeless tobacco. He reports that he does not drink alcohol or use drugs.   Family History:  The patient's family history includes Cancer - Lung in his brother; Heart attack in his father.    ROS:  Please see the history of present illness.   Otherwise, review of systems are positive for recurrent fluid on lung.   All other systems are reviewed and negative.    PHYSICAL EXAM: VS:  BP (!) 160/80   Pulse 68   Ht 5\' 7"  (1.702 m)   Wt 168 lb 6.4 oz (76.4 kg)   SpO2 97%   BMI 26.38 kg/m  , BMI Body mass index is 26.38 kg/m. GEN: Well nourished, well developed, in no acute distress  HEENT: normal  Neck: no JVD, carotid  bruits, or masses Cardiac: irregularly irregular; no murmurs, rubs, or gallops,no edema  Respiratory:  clear to auscultation bilaterally, normal work of breathing GI: soft, nontender, nondistended, + BS MS: no deformity or atrophy  Skin: warm and dry, no rash Neuro:  Strength and sensation are intact Psych: euthymic mood, full affect   EKG:   The ekg ordered today demonstrates AFib, rate controlled   Recent Labs: 02/10/2019: ALT 10; Hemoglobin 14.9; Platelets 219.0; Potassium 4.7; Sodium 137 07/24/2019: BUN 24; Creatinine, Ser 1.22   Lipid Panel    Component Value Date/Time   CHOL 180 03/28/2016 0728   TRIG 179 (H) 03/28/2016 0728   HDL 37 (L) 03/28/2016 0728   CHOLHDL 4.9 03/28/2016 0728   VLDL 36 (H) 03/28/2016 0728   LDLCALC 107 03/28/2016 0728   LDLDIRECT 86.0 05/13/2015 0739     Other studies Reviewed: Additional studies/ records that were reviewed today with results demonstrating: labs reviewed.   ASSESSMENT AND PLAN:  1. AFib: Rate controlled. Continue current rate control meds.  2. Anticoagulated: Eliquis for stroke prevention.  3. Hypertension: 152/58 on my recheck. Increase Avalide to 300 /12.5 mg daily.  [facility].  Will need BP checked at next pulm appt along with BMet,  He prefers to get his f/u done there since it will save him a trip to our office.  4. Hyperlipidemia: LDL96 in 12/19   Current medicines are reviewed at length with the patient today.  The patient concerns regarding his medicines were addressed.  The following changes have been made:  No change  Labs/ tests ordered today include: BMet when he sees Dr. Loanne Drilling for thoracentesis.  No orders of the defined types were placed in this encounter.   Recommend 150 minutes/week of aerobic exercise Low fat, low carb, high fiber diet recommended  Disposition:   FU in 6 months   Signed, Larae Grooms, MD  10/31/2019 3:48 PM    [facility] [address], [place],  [place]  [address] Phone: [phone]; Fax: [phone]

## 2019-10-31 ENCOUNTER — Encounter: Payer: Self-pay | Admitting: Interventional Cardiology

## 2019-10-31 ENCOUNTER — Other Ambulatory Visit: Payer: Self-pay

## 2019-10-31 ENCOUNTER — Ambulatory Visit: Payer: Medicare Other | Admitting: Interventional Cardiology

## 2019-10-31 VITALS — BP 160/80 | HR 68 | Ht 67.0 in | Wt 168.4 lb

## 2019-10-31 DIAGNOSIS — Z7901 Long term (current) use of anticoagulants: Secondary | ICD-10-CM | POA: Diagnosis not present

## 2019-10-31 DIAGNOSIS — I4811 Longstanding persistent atrial fibrillation: Secondary | ICD-10-CM

## 2019-10-31 DIAGNOSIS — I10 Essential (primary) hypertension: Secondary | ICD-10-CM

## 2019-10-31 DIAGNOSIS — E782 Mixed hyperlipidemia: Secondary | ICD-10-CM

## 2019-10-31 MED ORDER — IRBESARTAN-HYDROCHLOROTHIAZIDE 300-12.5 MG PO TABS: 1.0000 | ORAL_TABLET | Freq: Every day | ORAL | 1 refills | Status: DC

## 2019-10-31 NOTE — Patient Instructions (Signed)
Medication Instructions:  Your physician has recommended you make the following change in your medication:   INCREASE: irbesartan-hydrochlorothiazide to 300-12.5 mg tablet: Take 1 tablet by mouth once a day  *If you need a refill on your cardiac medications before your next appointment, please call your pharmacy*  Lab Work: Please have your lung doctor check a BMET and your Blood Pressure  If you have labs (blood work) drawn today and your tests are completely normal, you will receive your results only by: Marland Kitchen MyChart Message (if you have MyChart) OR . A paper copy in the mail If you have any lab test that is abnormal or we need to change your treatment, we will call you to review the results.  Testing/Procedures: None ordered  Follow-Up: At [facility], you and your health needs are our priority.  As part of our continuing mission to provide you with exceptional heart care, we have created designated Provider Care Teams.  These Care Teams include your primary Cardiologist (physician) and Advanced Practice Providers (APPs -  Physician Assistants and Nurse Practitioners) who all work together to provide you with the care you need, when you need it.  Your next appointment:   6 months  The format for your next appointment:   In Person  Provider:   You may see Larae Grooms, MD or one of the following Advanced Practice Providers on your designated Care Team:    Melina Copa, PA-C  [name], PA-C   Other Instructions

## 2019-11-05 NOTE — Addendum Note (Signed)
Addended by: Amado Coe on: 11/05/2019 02:18 PM   Modules accepted: Orders

## 2019-11-06 ENCOUNTER — Other Ambulatory Visit: Payer: Self-pay | Admitting: Pulmonary Disease

## 2019-11-06 ENCOUNTER — Telehealth: Payer: Self-pay | Admitting: *Deleted

## 2019-11-06 ENCOUNTER — Ambulatory Visit: Payer: Medicare Other | Admitting: Pulmonary Disease

## 2019-11-06 DIAGNOSIS — J940 Chylous effusion: Secondary | ICD-10-CM

## 2019-11-06 DIAGNOSIS — I898 Other specified noninfective disorders of lymphatic vessels and lymph nodes: Secondary | ICD-10-CM

## 2019-11-06 NOTE — Telephone Encounter (Signed)
-----   Message from [place], MD sent at 11/05/2019  9:52 PM EST ----- Regarding: RE: CT No. I do not want a Super D. Standard CT Chest with contrast is sufficient. No need for abdomen pelvis ----- Message ----- From: Amado Coe, RN Sent: 11/05/2019   2:13 PM EST To: Margaretha Seeds, MD Subject: CT                                             So the order is for a Super D. Is that what you want? And when do you want this scheduled?  Thank you  LR ----- Message ----- From: Margaretha Seeds, MD Sent: 10/26/2019  12:18 PM EST To: Amado Coe, RN  Changed my mind. I only want a CT Chest with and without contrast. No thora. Please arrange telephone visit after imaging obtained.

## 2019-11-06 NOTE — Telephone Encounter (Signed)
Orders placed for ct with contrast and a bmet Told patient once scheduled to get lab work done in office and that someone would be calling him to schedule the CT  Nothing further needed at this timel

## 2019-11-11 ENCOUNTER — Ambulatory Visit: Payer: Medicare Other | Admitting: Pulmonary Disease

## 2019-11-12 ENCOUNTER — Ambulatory Visit
Admission: RE | Admit: 2019-11-12 | Discharge: 2019-11-12 | Disposition: A | Discharge status: Home / Self Care | Payer: Medicare Other | Source: Ambulatory Visit | Attending: Pulmonary Disease | Admitting: Pulmonary Disease

## 2019-11-12 DIAGNOSIS — J940 Chylous effusion: Secondary | ICD-10-CM

## 2019-11-12 DIAGNOSIS — I898 Other specified noninfective disorders of lymphatic vessels and lymph nodes: Secondary | ICD-10-CM

## 2019-11-12 MED ORDER — IOPAMIDOL (ISOVUE-300) INJECTION 61%
75.0000 mL | Freq: Once | INTRAVENOUS | Status: AC | PRN
  Administered 2019-11-12: 75 mL via INTRAVENOUS

## 2019-11-14 ENCOUNTER — Telehealth: Payer: Self-pay | Admitting: Pulmonary Disease

## 2019-11-14 NOTE — Telephone Encounter (Signed)
I contacted patient for CT Chest results from 11/12/19 with right chylothorax. He reports mild shortness of breath since our last visit. Will arrange for in-office thoracentesis on 11/18/19 at 2:30 pm. Patient advised to hold Eliquis two days prior to procedure. He does not need to be NPO.  Please block my schedule from 2:30-3:30 for thoracentesis.

## 2019-11-17 NOTE — Telephone Encounter (Signed)
Schedule has been blocked. Nothing further needed at this time.

## 2019-11-18 ENCOUNTER — Other Ambulatory Visit (HOSPITAL_COMMUNITY)
Admission: RE | Admit: 2019-11-18 | Discharge: 2019-11-18 | Disposition: A | Discharge status: Home / Self Care | Payer: Medicare Other | Source: Ambulatory Visit | Attending: Pulmonary Disease | Admitting: Pulmonary Disease

## 2019-11-18 ENCOUNTER — Other Ambulatory Visit: Payer: Self-pay

## 2019-11-18 ENCOUNTER — Ambulatory Visit: Payer: Medicare Other | Admitting: Pulmonary Disease

## 2019-11-18 ENCOUNTER — Encounter: Payer: Self-pay | Admitting: Pulmonary Disease

## 2019-11-18 ENCOUNTER — Ambulatory Visit (INDEPENDENT_AMBULATORY_CARE_PROVIDER_SITE_OTHER): Payer: Medicare Other

## 2019-11-18 VITALS — BP 148/60 | HR 66 | Temp 97.3°F | Ht 67.0 in | Wt 168.4 lb

## 2019-11-18 DIAGNOSIS — Z9889 Other specified postprocedural states: Secondary | ICD-10-CM | POA: Diagnosis present

## 2019-11-18 DIAGNOSIS — I898 Other specified noninfective disorders of lymphatic vessels and lymph nodes: Secondary | ICD-10-CM | POA: Diagnosis not present

## 2019-11-18 DIAGNOSIS — J90 Pleural effusion, not elsewhere classified: Secondary | ICD-10-CM | POA: Insufficient documentation

## 2019-11-18 DIAGNOSIS — R0602 Shortness of breath: Secondary | ICD-10-CM | POA: Diagnosis not present

## 2019-11-18 DIAGNOSIS — J940 Chylous effusion: Secondary | ICD-10-CM

## 2019-11-18 NOTE — Patient Instructions (Signed)
Refer to dietician

## 2019-11-18 NOTE — Addendum Note (Signed)
Addended by: Suzzanne Cloud E on: 11/18/2019 03:34 PM   Modules accepted: Orders

## 2019-11-18 NOTE — Progress Notes (Signed)
Subjective:   PATIENT ID: Nicholas Cruz GENDER: male DOB: 06-16-1931, MRN: [id number]   HPI  Chief Complaint  Patient presents with  . thoracentesis   Mr. Halley Elza is a 85 year old male with atrial fibrillation on Eliquis, hypertension and hyperlipidemia who presents for follow-up of right chylothorax.  He was initially seen in clinic with me on 12/26/2018 for right pleural effusion seen on chest imaging since 11/21/18. Underwent thoracentesis on 1/13 with 1.3L of milky fluid consistent with chylothorax. He has had repeat thoracentesis on 3/2 and 8/10. Since our last visit, he has mild dyspnea on exertion but does not feel this limits his ADLS. Worsens with strenuous activity. Improves with rest and post-thoracentesis. He lives with his wife and daughter. His daughter prepares the meals at home and has asked him to inquire about nutritional guidance for his low-fat diet to help with his chylothorax. Denies fevers, chills, unintentional weight loss or night sweats.  He had contacted our office last week to report that his dyspnea is worse and would like to pursue thoracentesis. He reports that the shortness of breath is noticeable and limits his usual activity.   Social History: Former smoker. Quit in 1980. 24 pack-years. No hx of thoracic surgery, radiation or any procedures except for thoracentesis  I have personally reviewed patient's past medical/family/social history/allergies/current medications.  Past Medical History:  Diagnosis Date  . Atrial fibrillation ([place]) 2008  . Colorectal polyps 1985  . Duodenal ulcer 1968  . H/O knee surgery 1990  . History of pulmonary embolism 2007  . Hyperlipidemia   . Hypertension      Family History  Problem Relation Age of Onset  . Heart attack Father   . Cancer - Lung Brother      Social History   Occupational History  . Occupation: retired  Tobacco Use  . Smoking status: Former Smoker    Packs/day: 2.00    Years: 31.00   Pack years: 62.00    Types: Cigarettes    Start date: 1949    Quit date: 12/18/1978    Years since quitting: 40.9  . Smokeless tobacco: Never Used  Substance and Sexual Activity  . Alcohol use: No  . Drug use: No  . Sexual activity: Not Currently    Partners: Female    Allergies  Allergen Reactions  . Pradaxa [Dabigatran Etexilate Mesylate] Other (See Comments)    Hemoptysis     Outpatient Medications Prior to Visit  Medication Sig Dispense Refill  . alendronate (FOSAMAX) 70 MG tablet Take 1 tablet by mouth once a week.    . digoxin (LANOXIN) 0.125 MG tablet Take 1 tablet (125 mcg total) by mouth daily. 90 tablet 3  . diltiazem (CARDIZEM CD) 360 MG 24 hr capsule Take 1 capsule (360 mg total) by mouth daily. 90 capsule 3  . ELIQUIS 5 MG TABS tablet TAKE 1 TABLET BY MOUTH TWO  TIMES DAILY 180 tablet 1  . irbesartan-hydrochlorothiazide (AVALIDE) 300-12.5 MG tablet Take 1 tablet by mouth daily. 90 tablet 1  . Omega-3 Fatty Acids (FISH OIL) 1000 MG CAPS Take 1,000 mg by mouth 2 (two) times daily.    . pantoprazole (PROTONIX) 40 MG tablet Take 1 tablet by mouth every day 60 tablet 0  . potassium chloride SA (K-DUR,KLOR-CON) 20 MEQ tablet Take 1 tablet by mouth every day 60 tablet 0  . pravastatin (PRAVACHOL) 80 MG tablet Take 1 tablet by mouth every day 60 tablet 0  . tamsulosin (FLOMAX)  0.4 MG CAPS capsule TAKE 1 CAPSULE BY MOUTH  DAILY. 90 capsule 1  . mometasone (NASONEX) 50 MCG/ACT nasal spray Place 2 sprays into the nose daily as needed.      No facility-administered medications prior to visit.    Review of Systems  Constitutional: Negative for chills, diaphoresis, fever, malaise/fatigue and weight loss.  HENT: Negative for congestion.   Respiratory: Positive for shortness of breath. Negative for cough, hemoptysis, sputum production and wheezing.   Cardiovascular: Negative for chest pain, palpitations and leg swelling.   Objective:   Vitals:   11/18/19 1406  BP: (!) 148/60   Pulse: 66  Temp: (!) 97.3 F (36.3 C)  TempSrc: Temporal  SpO2: 97%  Weight: 168 lb 6.4 oz (76.4 kg)  Height: 5\' 7"  (1.702 m)   Body mass index is 26.38 kg/m.  SpO2: 97 %(on RA) O2 Device: None (Room air)  Physical Exam: General: Well-appearing, no acute distress HENT: [place], AT Eyes: EOMI, no scleral icterus Respiratory: Absent basilar right lung sounds, no wheezing Cardiovascular: RRR, -M/R/G, no JVD Extremities:-Edema,-tenderness Neuro: AAO x4, CNII-XII grossly intact Skin: Intact, no rashes or bruising Psych: Normal mood, normal affect  Data Reviewed  Chest imaging: CXR 02/10/19 - Recurrent small/moderate right pleural effusion CT CAP 02/17/19 - Right pleural effusion, borderline thoracic lymphadenopathy, LUL nodularity concerning for infection, unable to visualize prior RLL pulmonary nodules CXR 07/15/19 - Moderate right pleural effusion CT Chest 07/28/19 - Unchanged mediastinal lymph nodes. Right loculated pleural effusion Bedside US 10/21/19 - right pleural effusion present, uncomplicated CT Chest [id number] - Unchanged RML nodule measuring 69mm, right pleural effusion, no adenopathy with similar sized hilar and mediastinal lymph nodes  PFT: None on file  Pleural Labs: Thoracentesis 07/28/19 and 02/17/19, respectively   Cultures - negative Cytology 1/13, 3/2, 8/10 - Negative for malignancy  Bedside Ultrasound 11/18/19: Small right pleural effusion    Imaging, labs and test noted above have been reviewed independently by me.  In-Clinic Procedure   Thoracentesis Procedure Note  Pre-operative Diagnosis: Recurrent right chylothorax  Post-operative Diagnosis: same  Indications: Recurrent right chylothorax  Procedure Details  Consent: Informed consent was obtained. Risks of the procedure were discussed including: infection, bleeding, pain, pneumothorax.  Under sterile conditions the patient was positioned. Betadine solution and sterile drapes were utilized.  1% plain  lidocaine was used to anesthetize the 7th rib space. Fluid was obtained without any difficulties and minimal blood loss.  A dressing was applied to the wound and wound care instructions were provided.   Findings 200 ml of milky pleural fluid was obtained. A sample was sent to Pathology for cytology and cell counts, as well as for infection analysis.  Complications:  None; patient tolerated the procedure well.          Condition: stable  Plan A follow up chest x-ray was reviewed. Improved right pleural effusion. No pneumothorax.  Assessment & Plan:   Recurrent right chylothorax  Discussion: 85 year old male former smoker with atrial fibrillation on Eliquis and hx of unprovoked PE in 2007 who presents with recurrent idiopathic right chylothorax.   Thoracentesis 12/30/18 - 1300 ml 02/17/19 - 1300 ml 07/28/19 - 256ml  Etiology remains unknown but does seem to improve with diet. No hx of thoracic surgery, significant trauma or malignancy. We again discussed the possibility of malignancy however his CT imaging does not show significant adenopathy or abnormalities. Patient expressed preference to minimize invasive procedures as he does not feel the effusion profoundly affects him. He  does report relief from thoracentesis today and will seek repeat procedure if needed.  Right chylothorax --Will re-refer to Antonieta Iba, RD. Patient is requesting Nutrition team to include his daughter for this visit as she is the primary cook in the home. --Repeat CXR at next visit --Thoracentesis as needed  Orders Placed This Encounter  Procedures  . Body Fluid Culture    Standing Status:   Future    Number of Occurrences:   1    Standing Expiration Date:   11/17/2020  . DG Chest 2 View    Standing Status:   Future    Number of Occurrences:   1    Standing Expiration Date:   01/18/2021    Order Specific Question:   Reason for Exam (SYMPTOM  OR DIAGNOSIS REQUIRED)    Answer:   post thoracentesis    Order  Specific Question:   Preferred imaging location?    Answer:   Internal    Order Specific Question:   Radiology Contrast Protocol - do NOT remove file path    Answer:   [email or web address]  . Lactate Dehydrogenase (LDH)    Standing Status:   Future    Number of Occurrences:   1    Standing Expiration Date:   11/17/2020  . Protein, total    Standing Status:   Future    Number of Occurrences:   1    Standing Expiration Date:   11/17/2020  . Basic Metabolic Panel (BMET)    Standing Status:   Future    Number of Occurrences:   1    Standing Expiration Date:   11/17/2020  . Body fluid cell count with differential  . Protein, body fluid (other)  . LD, Body Fluid  . Glucose, Body Fluid Other  . Cholesterol, Total    Standing Status:   Future    Number of Occurrences:   1    Standing Expiration Date:   11/17/2020  . Triglycerides    Standing Status:   Future    Number of Occurrences:   1    Standing Expiration Date:   11/17/2020  No orders of the defined types were placed in this encounter.  Return in about 3 months (around 02/16/2020).  Maveryck Bahri Rodman Pickle, MD [facility] 11/18/2019 2:35 PM  Personal pager: [phone] If unanswered, please page CCM On-call: [phone]

## 2019-11-18 NOTE — Addendum Note (Signed)
Addended by: Suzzanne Cloud E on: 11/18/2019 03:41 PM   Modules accepted: Orders

## 2019-11-19 LAB — CBC WITH DIFFERENTIAL/PLATELET
Basophils Absolute: 0.1 10*3/uL (ref 0.0–0.1)
Basophils Relative: 0.5 % (ref 0.0–3.0)
Eosinophils Absolute: 0.2 10*3/uL (ref 0.0–0.7)
Eosinophils Relative: 2.1 % (ref 0.0–5.0)
HCT: 43.5 % (ref 39.0–52.0)
Hemoglobin: 14.5 g/dL (ref 13.0–17.0)
Lymphocytes Relative: 15.7 % (ref 12.0–46.0)
Lymphs Abs: 1.8 10*3/uL (ref 0.7–4.0)
MCHC: 33.4 g/dL (ref 30.0–36.0)
MCV: 87.7 fl (ref 78.0–100.0)
Monocytes Absolute: 0.9 10*3/uL (ref 0.1–1.0)
Monocytes Relative: 8.3 % (ref 3.0–12.0)
Neutro Abs: 8.2 10*3/uL — ABNORMAL HIGH (ref 1.4–7.7)
Neutrophils Relative %: 73.4 % (ref 43.0–77.0)
Platelets: 204 10*3/uL (ref 150.0–400.0)
RBC: 4.96 Mil/uL (ref 4.22–5.81)
RDW: 15.8 % — ABNORMAL HIGH (ref 11.5–15.5)
WBC: 11.2 10*3/uL — ABNORMAL HIGH (ref 4.0–10.5)

## 2019-11-19 LAB — BASIC METABOLIC PANEL
BUN: 25 mg/dL — ABNORMAL HIGH (ref 6–23)
CO2: 28 mEq/L (ref 19–32)
Calcium: 9.6 mg/dL (ref 8.4–10.5)
Chloride: 101 mEq/L (ref 96–112)
Creatinine, Ser: 1.22 mg/dL (ref 0.40–1.50)
GFR: 56.26 mL/min — ABNORMAL LOW (ref >60.00)
Glucose, Bld: 148 mg/dL — ABNORMAL HIGH (ref 70–99)
Potassium: 4.3 mEq/L (ref 3.5–5.1)
Sodium: 136 mEq/L (ref 135–145)

## 2019-11-19 LAB — CHOLESTEROL, TOTAL: Cholesterol: 179 mg/dL (ref <200)

## 2019-11-19 LAB — PROTEIN, TOTAL: Total Protein: 7.3 g/dL (ref 6.0–8.3)

## 2019-11-19 LAB — TRIGLYCERIDES: Triglycerides: 226 mg/dL — ABNORMAL HIGH (ref <150)

## 2019-11-19 LAB — LACTATE DEHYDROGENASE: LDH: 175 U/L (ref 120–250)

## 2019-11-20 ENCOUNTER — Telehealth: Payer: Self-pay | Admitting: *Deleted

## 2019-11-20 LAB — PROTEIN, BODY FLUID (OTHER): Protein, Fluid: 3.5 g/dL

## 2019-11-20 LAB — LD, BODY FLUID (OTHER): LD, Body Fluid: 157 IU/L

## 2019-11-20 LAB — CYTOLOGY - NON PAP

## 2019-11-20 LAB — GLUCOSE, BODY FLUID OTHER: Glucose, Fluid: 115 mg/dL

## 2019-11-20 NOTE — Telephone Encounter (Signed)
-----   Message from [place], MD sent at 11/19/2019  9:00 PM EST ----- Regarding: Thoracentesis labs LR - there has been a huge error with the pleural labs drawn for the thoracentesis. Somehow the triglyceride and cholesterol levels which were meant for the pleural fluid ended up being a blood draw.   Also, I do not see any of the results for the pleural fluid. Cell count, LDH, total protein, triglyceride, cholesterol, culture and cytology in process. Usually the first 5 labs result within 24 hours.  Please call me in the morning when u receive this message.  JE

## 2019-11-22 LAB — BODY FLUID CULTURE

## 2019-11-25 ENCOUNTER — Telehealth: Payer: Self-pay | Admitting: *Deleted

## 2019-11-25 DIAGNOSIS — J940 Chylous effusion: Secondary | ICD-10-CM

## 2019-11-25 DIAGNOSIS — I898 Other specified noninfective disorders of lymphatic vessels and lymph nodes: Secondary | ICD-10-CM

## 2019-11-25 NOTE — Telephone Encounter (Signed)
-----   Message from [place], MD sent at 11/24/2019  2:19 PM EST ----- Regarding: Chylothorax LR - please refer patient to Dietician for chylothorax.  For chylothorax Dietician referral: Antonieta Iba, RD, CDE  [facility]  [phone]  [phone] fax   Please Fax a referral to [phone] and Referral to [facility] into epic with the patient's name and DOB   JE

## 2019-12-02 ENCOUNTER — Telehealth: Payer: Self-pay | Admitting: Pulmonary Disease

## 2019-12-02 NOTE — Telephone Encounter (Signed)
Attempted to call, no answer, left message to call back. Patient had a thoracentesis in office on 11/18/2019 and they removed 200 mL of milky pleural fluid.

## 2019-12-03 NOTE — Telephone Encounter (Signed)
Called and spoke to [place] with the lab. Advised the type of fluid that was sent. Nothing further needed at this time.

## 2019-12-23 ENCOUNTER — Other Ambulatory Visit: Payer: Self-pay | Admitting: Interventional Cardiology

## 2020-01-13 LAB — SPECIMEN STATUS REPORT

## 2020-01-13 LAB — TRIGLYCERIDES, BODY FLUIDS: Triglycerides, Fluid: 1625 mg/dL

## 2020-01-13 LAB — CHOLESTEROL, BODY FLUID: Cholesterol, Fluid: 62 mg/dL

## 2020-01-20 ENCOUNTER — Other Ambulatory Visit: Payer: Self-pay | Admitting: Interventional Cardiology

## 2020-01-21 NOTE — Telephone Encounter (Signed)
Pt last saw Dr Irish Lack 10/31/19, last labs 11/18/19 Creat 1.22, age 86, weight 76.4kg, based on specified criteria pt is on appropriate dosage of Eliquis 5mg  BID.  Will refill rx.

## 2020-02-02 ENCOUNTER — Telehealth: Payer: Self-pay | Admitting: *Deleted

## 2020-02-02 NOTE — Telephone Encounter (Signed)
-----   Message from [place], MD sent at 01/28/2020  1:44 PM EST ----- Regarding: RE: Chylothorax LR - can we schedule this patient for follow-up with me with CXR either March or April? ----- Message ----- From: Amado Coe, RN Sent: 11/26/2019  11:23 AM EST To: Chi Rodman Pickle, MD Subject: RE: Chylothorax                                Dr. Loanne Drilling,  I called the number, he is on the list. However, according to whom I spoke with. It will not be covered by insurance and will cost $240. The nutrition place will notify patient to see if he still wants to schedule.  Thank you  LR ----- Message ----- From: Margaretha Seeds, MD Sent: 11/26/2019   7:00 AM EST To: Amado Coe, RN Subject: FW: Chylothorax                                I filled it out to the best of my ability. Can u call the below number to make sure the referral is correct.   This is a referral for a diagnosis of chylothorax and diet to manage it. NOT a diabetic education. And we specifically want Antonieta Iba or any dietician who specializes in chylothorax. ----- Message ----- From: Clydell Hakim, RD Sent: 11/26/2019   6:46 AM EST To: Chi Rodman Pickle, MD Subject: RE: Chylothorax                                [facility] Please call [phone] if you have any questions. ----- Message ----- From: Margaretha Seeds, MD Sent: 11/25/2019   7:08 PM EST To: Clydell Hakim, RD Subject: Chylothorax                                    Hi Mickel Baas,  I am re-referring a patient to you for management of chylothorax via diet. I am trying to place a referral in Epic but when I pull up your name, it states you are a non-preferred provider. Are you outside of the [facility] system?   Rodman Pickle, MD

## 2020-02-02 NOTE — Telephone Encounter (Signed)
Called number on file. Was daughter Olin Hauser, she will have patient call and make appointment

## 2020-03-11 ENCOUNTER — Other Ambulatory Visit: Payer: Self-pay | Admitting: Physician Assistant

## 2020-03-16 ENCOUNTER — Other Ambulatory Visit: Payer: Self-pay

## 2020-03-16 ENCOUNTER — Ambulatory Visit: Payer: Medicare Other | Admitting: Pulmonary Disease

## 2020-03-16 DIAGNOSIS — Z5329 Procedure and treatment not carried out because of patient's decision for other reasons: Secondary | ICD-10-CM

## 2020-03-16 NOTE — Progress Notes (Signed)
Patient declined visit.  No charge.

## 2020-03-22 ENCOUNTER — Other Ambulatory Visit: Payer: Self-pay | Admitting: Interventional Cardiology

## 2020-07-12 ENCOUNTER — Other Ambulatory Visit: Payer: Self-pay

## 2020-07-12 ENCOUNTER — Encounter: Payer: Self-pay | Admitting: Primary Care

## 2020-07-12 ENCOUNTER — Ambulatory Visit (INDEPENDENT_AMBULATORY_CARE_PROVIDER_SITE_OTHER): Payer: Medicare Other

## 2020-07-12 ENCOUNTER — Telehealth: Payer: Self-pay | Admitting: Primary Care

## 2020-07-12 ENCOUNTER — Ambulatory Visit (INDEPENDENT_AMBULATORY_CARE_PROVIDER_SITE_OTHER): Payer: Medicare Other | Admitting: Primary Care

## 2020-07-12 VITALS — BP 130/64 | HR 77 | Temp 98.0°F | Ht 67.0 in | Wt 163.2 lb

## 2020-07-12 DIAGNOSIS — J940 Chylous effusion: Secondary | ICD-10-CM

## 2020-07-12 DIAGNOSIS — I4811 Longstanding persistent atrial fibrillation: Secondary | ICD-10-CM | POA: Diagnosis not present

## 2020-07-12 DIAGNOSIS — I898 Other specified noninfective disorders of lymphatic vessels and lymph nodes: Secondary | ICD-10-CM

## 2020-07-12 DIAGNOSIS — M7989 Other specified soft tissue disorders: Secondary | ICD-10-CM

## 2020-07-12 NOTE — Telephone Encounter (Signed)
Looks like the first available would with with Dr Carlis Abbott 8/16 is this too late?

## 2020-07-12 NOTE — Telephone Encounter (Signed)
Matt, do you think you would be willing to do an office based thora with the butterfly ultrasound on Thursday?  Otherwise we can setup in endo for Friday?  Thanks  Garner Nash, DO [facility] 07/12/2020 5:48 PM

## 2020-07-12 NOTE — Assessment & Plan Note (Signed)
-   Irregular rhythm, HR 77 - Continues on Eliquis, Diltiazem and Digoxin

## 2020-07-12 NOTE — Patient Instructions (Signed)
Pleasure meeting you today Nicholas Cruz  CXR today showed persistent/recurrent right pleural effusion  Recommend you take medication for leg swelling one or two times this week   I will get in contact with Dr. Loanne Drilling and see if we can get you scheduled for a thoracentesis in office or at [facility]   Follow-up: 3 months with Dr. Loanne Drilling or sooner if needed   Pleural Effusion Pleural effusion is an abnormal buildup of fluid in the layers of tissue between the lungs and the inside of the chest (pleural space) The two layers of tissue that line the lungs and the inside of the chest are called pleura. Usually, there is no air in the space between the pleura, only a thin layer of fluid. Some conditions can cause a large amount of fluid to build up, which can cause the lung to collapse if untreated. A pleural effusion is usually caused by another disease that requires treatment. What are the causes? Pleural effusion can be caused by:  Heart failure.  Certain infections, such as pneumonia or tuberculosis.  Cancer.  A blood clot in the lung (pulmonary embolism).  Complications from surgery, such as from open heart surgery.  Liver disease (cirrhosis).  Kidney disease. What are the signs or symptoms? In some cases, pleural effusion may cause no symptoms. If symptoms are present, they may include:  Shortness of breath, especially when lying down.  Chest pain. This may get worse when taking a deep breath.  Fever.  Dry, long-lasting (chronic) cough.  Hiccups.  Rapid breathing. An underlying condition that is causing the pleural effusion (such as heart failure, pneumonia, blood clots, tuberculosis, or cancer) may also cause other symptoms. How is this diagnosed? This condition may be diagnosed based on:  Your symptoms and medical history.  A physical exam.  A chest X-ray.  A procedure to use a needle to remove fluid from the pleural space (thoracentesis). This fluid is  tested.  Other imaging studies of the chest, such as ultrasound or CT scan. How is this treated? Depending on the cause of your condition, treatment may include:  Treating the underlying condition that is causing the effusion. When that condition improves, the effusion will also improve. Examples of treatment for underlying conditions include: ? Antibiotic medicines to treat an infection. ? Diuretics or other heart medicines to treat heart failure.  Thoracentesis.  Placing a thin flexible tube under your skin and into your chest to continuously drain the effusion (indwelling pleural catheter).  Surgery to remove the outer layer of tissue from the pleural space (decortication).  A procedure to put medicine into the chest cavity to seal the pleural space and prevent fluid buildup (pleurodesis).  Chemotherapy and radiation therapy, if you have cancerous (malignant) pleural effusion. These treatments are typically used to treat cancer. They kill certain cells in the body. Follow these instructions at home:  Take over-the-counter and prescription medicines only as told by your health care provider.  Ask your health care provider what activities are safe for you.  Keep track of how long you are able to do mild exercise (such as walking) before you get short of breath. Write down this information to share with your health care provider. Your ability to exercise should improve over time.  Do not use any products that contain nicotine or tobacco, such as cigarettes and e-cigarettes. If you need help quitting, ask your health care provider.  Keep all follow-up visits as told by your health care provider. This is  important. Contact a health care provider if:  The amount of time that you are able to do mild exercise: ? Decreases. ? Does not improve with time.  You have a fever. Get help right away if:  You are short of breath.  You develop chest pain.  You develop a new  cough. Summary  Pleural effusion is an abnormal buildup of fluid in the layers of tissue between the lungs and the inside of the chest.  Pleural effusion can have many causes, including heart failure, pulmonary embolism, infections, or cancer.  Symptoms of pleural effusion can include shortness of breath, chest pain, fever, long-lasting (chronic) cough, hiccups, or rapid breathing.  Diagnosis often involves making images of the chest (such as with ultrasound or X-ray) and removing fluid (thoracentesis) to send for testing.  Treatment for pleural effusion depends on what underlying condition is causing it. This information is not intended to replace advice given to you by your health care provider. Make sure you discuss any questions you have with your health care provider. Document Revised: 11/16/2017 Document Reviewed: 08/09/2017 Elsevier Patient Education  2020 [employer].

## 2020-07-12 NOTE — Telephone Encounter (Signed)
Dr. Silas Flood is out for the day , but can discuss with him first thing in the am or we can see if they can get to the Endo sweet and have done at hospital with Dr. Tamala Julian or Dr. Valeta Harms.?

## 2020-07-12 NOTE — Telephone Encounter (Signed)
He looks stable but that is further out than I would like. Can the new physician see him? Would sort of be a consult needs 30 mins  Cc: Loanne Drilling

## 2020-07-12 NOTE — Assessment & Plan Note (Addendum)
- +  1-2BLE edema - Recommend patient take diuretic 1-2 times this week for leg swelling

## 2020-07-12 NOTE — Progress Notes (Signed)
@Patient  ID: Nicholas Cruz, male    DOB: 02-05-1931, 86 y.o.   MRN: [id number]  Chief Complaint  Patient presents with  . Follow-up    Referring provider: Josetta Huddle, MD  HPI: 86 year old male, former smoker.  Past medical history significant for recurrent right chylothorax, A. fib on Eliquis, unprovoked PE, hypertension, hyperlipidemia patient of Dr. Loanne Drilling, last seen in office 11/18/19 for thoracentesis. Etiology remains unknown but does seem to improve with diet. No hx of thoracic surgery, significant trauma or malignancy. We again discussed the possibility of malignancy however his CT imaging does not show significant adenopathy or abnormalities. Plan repeat CXR at next visit. Thoracentesis as needed12/12/2018.  Previous [facility] pulmonary encounter: 11/18/2019-Dr. Loanne Drilling [name] is a 86 year old male with atrial fibrillation on Eliquis, hypertension and hyperlipidemia who presents for follow-up of right chylothorax.  He was initially seen in clinic with me on 12/26/2018 for right pleural effusion seen on chest imaging since 11/21/18. Underwent thoracentesis on 1/13 with 1.3L of milky fluid consistent with chylothorax. He has had repeat thoracentesis on 3/2 and 8/10. Since our last visit, he has mild dyspnea on exertion but does not feel this limits his ADLS. Worsens with strenuous activity. Improves with rest and post-thoracentesis. He lives with his wife and daughter. His daughter prepares the meals at home and has asked him to inquire about nutritional guidance for his low-fat diet to help with his chylothorax. Denies fevers, chills, unintentional weight loss or night sweats.  He had contacted our office last week to report that his dyspnea is worse and would like to pursue thoracentesis. He reports that the shortness of breath is noticeable and limits his usual activity.   Social History: Former smoker. Quit in 1980. 24 pack-years. No hx of thoracic surgery, radiation or any  procedures except for thoracentesis   07/12/2020- interim hx Patient presents today for acute visit with reports of shortness of breath.  Chest x-ray today showed persistent/recurrent small right pleural effusion.  Most recent thoracentesis was in office with Dr. Loanne Drilling in December 2020.  Shortness of breath started a couple of weeks ago. He was referred to nutrition to manage chylothorax but did not follow-up with them and states that he has no plans on going because his wife is sick and he can not leave her. Patient states that he has a medication (presumed to be a diuretic) to take as needed for leg swelling.  He has Pfizer vaccines 01/29/20 and 02/19/20.     TESTING Pleural Labs: When you have time those are his visors I saw him for [place] Thoracentesis 07/28/19 and 02/17/19, respectively   Cultures: Cytology 1/13, 3/2, 8/10 - Negative for malignancy Cultures - Negative  Thoracentesis: 12/30/18 - 1300 ml 02/17/19 - 1300 ml 07/28/19 - 235ml  Imaging: CT chest There is a 4 mm right middle lobe nodule similar to prior CT. There is mild centrilobular emphysema. Similar or minimal interval increase in the size of the right pleural effusion compared to the prior CT. No pneumothorax.    Allergies  Allergen Reactions  . Pradaxa [Dabigatran Etexilate Mesylate] Other (See Comments)    Hemoptysis    Immunization History  Administered Date(s) Administered  . Influenza Whole 09/17/2013  . Influenza, High Dose Seasonal PF 07/26/2016, 09/19/2017, 09/06/2018, 09/02/2019  . Influenza, Seasonal, Injecte, Preservative Fre 09/17/2014  . Influenza-Unspecified 09/30/2015  . PFIZER SARS-COV-2 Vaccination 01/29/2020, 02/19/2020  . Pneumococcal Conjugate-13 07/01/2014, 09/30/2015  . Pneumococcal-Unspecified 09/02/2012  . Tdap 12/02/2010  .  Zoster 11/05/2007    Past Medical History:  Diagnosis Date  . Atrial fibrillation ([place]) 2008  . Colorectal polyps 1985  . Duodenal ulcer 1968  . H/O knee  surgery 1990  . History of pulmonary embolism 2007  . Hyperlipidemia   . Hypertension     Tobacco History: Social History   Tobacco Use  Smoking Status Former Smoker  . Packs/day: 2.00  . Years: 31.00  . Pack years: 62.00  . Types: Cigarettes  . Start date: 40  . Quit date: 12/18/1978  . Years since quitting: 41.5  Smokeless Tobacco Never Used   Counseling given: Not Answered   Outpatient Medications Prior to Visit  Medication Sig Dispense Refill  . alendronate (FOSAMAX) 70 MG tablet Take 1 tablet by mouth once a week.    . digoxin (LANOXIN) 0.125 MG tablet TAKE 1 TABLET BY MOUTH  DAILY 90 tablet 3  . diltiazem (CARDIZEM CD) 360 MG 24 hr capsule TAKE 1 CAPSULE BY MOUTH  DAILY 90 capsule 2  . ELIQUIS 5 MG TABS tablet TAKE 1 TABLET BY MOUTH  TWICE DAILY 180 tablet 2  . irbesartan-hydrochlorothiazide (AVALIDE) 300-12.5 MG tablet TAKE 1 TABLET BY MOUTH  DAILY 90 tablet 2  . mometasone (NASONEX) 50 MCG/ACT nasal spray Place 2 sprays into the nose daily as needed.     . Omega-3 Fatty Acids (FISH OIL) 1000 MG CAPS Take 1,000 mg by mouth 2 (two) times daily.    . pantoprazole (PROTONIX) 40 MG tablet Take 1 tablet by mouth every day 60 tablet 0  . potassium chloride SA (K-DUR,KLOR-CON) 20 MEQ tablet Take 1 tablet by mouth every day 60 tablet 0  . pravastatin (PRAVACHOL) 80 MG tablet Take 1 tablet by mouth every day 60 tablet 0  . tamsulosin (FLOMAX) 0.4 MG CAPS capsule TAKE 1 CAPSULE BY MOUTH  DAILY. 90 capsule 1   No facility-administered medications prior to visit.   Review of Systems  Review of Systems  Constitutional: Negative.   Respiratory: Positive for shortness of breath. Negative for cough, chest tightness and wheezing.    Physical Exam  BP (!) 130/64 (BP Location: Left Arm, Cuff Size: Normal)   Pulse 77   Temp 98 F (36.7 C) (Oral)   Ht 5\' 7"  (1.702 m)   Wt 163 lb 3.2 oz (74 kg)   SpO2 97%   BMI 25.56 kg/m  Physical Exam Constitutional:      General: He is  not in acute distress.    Appearance: Normal appearance. He is not ill-appearing.  HENT:     Head: Normocephalic and atraumatic.     Mouth/Throat:     Mouth: Mucous membranes are moist.     Pharynx: Oropharynx is clear.  Cardiovascular:     Rate and Rhythm: Rhythm irregular.     Comments: +1BLE edema  Pulmonary:     Effort: No respiratory distress.     Breath sounds: No stridor. No wheezing, rhonchi or rales.     Comments: Diminished right base Neurological:     General: No focal deficit present.     Mental Status: He is alert and oriented to person, place, and time. Mental status is at baseline.  Psychiatric:        Mood and Affect: Mood normal.        Behavior: Behavior normal.        Thought Content: Thought content normal.        Judgment: Judgment normal.  Lab Results:  CBC    Component Value Date/Time   WBC 11.2 (H) 11/18/2019 1541   RBC 4.96 11/18/2019 1541   HGB 14.5 11/18/2019 1541   HCT 43.5 11/18/2019 1541   PLT 204.0 11/18/2019 1541   MCV 87.7 11/18/2019 1541   MCH 29.7 12/30/2018 1135   MCHC 33.4 11/18/2019 1541   RDW 15.8 (H) 11/18/2019 1541   LYMPHSABS 1.8 11/18/2019 1541   MONOABS 0.9 11/18/2019 1541   EOSABS 0.2 11/18/2019 1541   BASOSABS 0.1 11/18/2019 1541    BMET    Component Value Date/Time   NA 136 11/18/2019 1536   K 4.3 11/18/2019 1536   CL 101 11/18/2019 1536   CO2 28 11/18/2019 1536   GLUCOSE 148 (H) 11/18/2019 1536   BUN 25 (H) 11/18/2019 1536   CREATININE 1.22 11/18/2019 1536   CREATININE 1.27 (H) 08/24/2016 1455   CALCIUM 9.6 11/18/2019 1536   GFRNONAA 52 (L) 08/24/2016 1455   GFRAA 60 08/24/2016 1455    BNP No results found for: BNP  ProBNP No results found for: PROBNP  Imaging: DG Chest 2 View  Result Date: 07/12/2020 CLINICAL DATA:  Shortness of breath, history of chylothorax EXAM: CHEST - 2 VIEW COMPARISON:  11/18/2019 FINDINGS: Persistent or recurrent small right pleural effusion. Mild right basilar  atelectasis. No pneumothorax. Stable cardiomediastinal contours. No acute osseous abnormality. IMPRESSION: Persistent/recurrent small right pleural effusion. Mild right basilar atelectasis. Electronically Signed   By: Macy Mis M.D.   On: 07/12/2020 09:27     Assessment & Plan:   Right chylothorax - Patient reports increased shortness of breath over the last several weeks. His oxygen level is 97% on room air. He appears comfortable at rest.   - Chest x-ray today showed persistent/recurrent right pleural effusion.  - Cytology 1/13, 3/2, 8/10 - Negative for malignancy - We will schedule him for thoracentesis either in office with Dr. Loanne Drilling or at [facility] with one of our other pulmonary providers - Follow-up in 3 months with repeat chest x-ray  Leg swelling - +1-2BLE edema - Recommend patient take diuretic 1-2 times this week for leg swelling  Atrial fibrillation - Irregular rhythm, HR 77 - Continues on Eliquis, Diltiazem and Digoxin    Martyn Ehrich, NP 07/12/2020

## 2020-07-12 NOTE — Telephone Encounter (Signed)
Please instruct that patient will need to hold Eliquis 48 hours prior to procedure- earliest he could do it in office would be Thursday

## 2020-07-12 NOTE — Telephone Encounter (Signed)
Can you please schedule patient thoracentesis in clinic with one of our providers first available. Will need 30 min slot.

## 2020-07-12 NOTE — Telephone Encounter (Signed)
Hi Dr. Loanne Drilling,  I saw this patient of yours who has a history of recurrent right chylothorax.  He presented today for an acute visit with complaints of increased shortness of breath over the last several weeks.  His oxygen level is 97% on room air.  He appears comfortable at rest.  Chest x-ray today showed persistent/recurrent right pleural effusion.  He did not go to nutrition as recommended and has no interest in this as his wife is sick and he cannot leave her.  Would you like me to set up a thoracentesis with you (if you are back) or with one of our providers at [place]

## 2020-07-12 NOTE — Telephone Encounter (Signed)
Beth,  Please arrange for patient to have thoracentesis in clinic. Any provider. Hold any anticoagulation prior to procedure. He will need evaluation by a provider to rediscuss management options. It would be reasonable to continue PRN thoracentesis as his fluid reaccumulation is slow but we could consider thoracic duct embolization.  Je

## 2020-07-12 NOTE — Progress Notes (Signed)
Discussed with Dr. Loanne Drilling who is out of the office on maternity leave currently. Plan to shedule patient for thoracentesis in clinic. Any provider. Hold any anticoagulation prior to procedure. He will need evaluation by a provider to rediscuss management options. It would be reasonable to continue PRN thoracentesis as his fluid reaccumulation is slow but we could consider thoracic duct embolization.

## 2020-07-12 NOTE — Assessment & Plan Note (Addendum)
-   Patient reports increased shortness of breath over the last several weeks. His oxygen level is 97% on room air. He appears comfortable at rest.   - Chest x-ray today showed persistent/recurrent right pleural effusion.  - Cytology 1/13, 3/2, 8/10 - Negative for malignancy - We will schedule him for thoracentesis either in office with Dr. Loanne Drilling or at [facility] with one of our other pulmonary providers - Follow-up in 3 months with repeat chest x-ray

## 2020-07-13 NOTE — Telephone Encounter (Signed)
Dr. Loanne Drilling, please advise on when you want [place] to get pt in for the thoracentesis.

## 2020-07-13 NOTE — Telephone Encounter (Signed)
Called spoke with patient, he took his eliquis this morning with breakfast. So it looks like this would have to be done on Friday , where does this need to be scheduled at now?  I have told patient to not take tomorrow and Thursday so we can do on Friday and I would call him back with a location.   BI please advise

## 2020-07-13 NOTE — Telephone Encounter (Signed)
Sorry Nicholas Cruz, so what's the plan? Patient is symptomatic with increased shortness of breath, he looks ok in office and vitals were stable but I don't think he is going to want to hold off on thoracentesis. He would prefer Dr. Loanne Drilling do procedure but I explained to him the situation and that she graciously offered as a LAST resort to come in.

## 2020-07-13 NOTE — Telephone Encounter (Signed)
Ok makes sense to do in the office. If small effusion she decide not to stick a needle in it.  Thanks BLI   Garner Nash, DO [facility] 07/13/2020 1:17 PM

## 2020-07-13 NOTE — Telephone Encounter (Signed)
I think Opal Sidles is willing to come to office to drain it. I would stick with that plan if she is amendable.  Thanks [employer]

## 2020-07-13 NOTE — Telephone Encounter (Signed)
Dr. Loanne Drilling said if we can't get him set up for thoracentesis with another provider this week in the office that she could come in. I talked with Nicholas Cruz, and he will hold further anticoagulation.

## 2020-07-13 NOTE — Telephone Encounter (Signed)
Staff - please schedule patient for Thursday at 10am. He will need to be in a gown with ultrasound and thoracentesis kit at bedside.  This is a 15 min clinic visit with an additional 15 min scheduled for procedure.  Rodman Pickle, M.D. [facility] 07/13/2020 6:01 PM

## 2020-07-14 NOTE — Telephone Encounter (Signed)
Please shedule patient Thursday 10am with Dr. Loanne Drilling. See message and make sure ultrasound and grown are ready. Also advise he continues to hold blood thinner today and tomorrow.

## 2020-07-14 NOTE — Telephone Encounter (Signed)
Spoke with the pt and notified of appt date and time and confirmed he is still holding the Eliquis. Appt scheduled.

## 2020-07-15 ENCOUNTER — Other Ambulatory Visit: Payer: Self-pay

## 2020-07-15 ENCOUNTER — Other Ambulatory Visit (HOSPITAL_COMMUNITY)
Admission: RE | Admit: 2020-07-15 | Discharge: 2020-07-15 | Disposition: A | Discharge status: Home / Self Care | Payer: Medicare Other | Attending: Pulmonary Disease | Admitting: Pulmonary Disease

## 2020-07-15 ENCOUNTER — Other Ambulatory Visit (HOSPITAL_COMMUNITY)
Admission: RE | Admit: 2020-07-15 | Discharge: 2020-07-15 | Disposition: A | Discharge status: Home / Self Care | Payer: Medicare Other | Source: Ambulatory Visit | Attending: Pulmonary Disease | Admitting: Pulmonary Disease

## 2020-07-15 ENCOUNTER — Encounter: Payer: Self-pay | Admitting: Pulmonary Disease

## 2020-07-15 ENCOUNTER — Ambulatory Visit (INDEPENDENT_AMBULATORY_CARE_PROVIDER_SITE_OTHER): Payer: Medicare Other

## 2020-07-15 ENCOUNTER — Ambulatory Visit: Payer: Medicare Other | Admitting: Pulmonary Disease

## 2020-07-15 VITALS — BP 160/58 | HR 117 | Temp 98.1°F | Ht 67.0 in | Wt 161.2 lb

## 2020-07-15 DIAGNOSIS — I898 Other specified noninfective disorders of lymphatic vessels and lymph nodes: Secondary | ICD-10-CM | POA: Insufficient documentation

## 2020-07-15 DIAGNOSIS — J940 Chylous effusion: Secondary | ICD-10-CM

## 2020-07-15 LAB — BODY FLUID CELL COUNT WITH DIFFERENTIAL
Lymphs, Fluid: 100 %
Total Nucleated Cell Count, Fluid: 77 cu mm (ref 0–1000)

## 2020-07-15 LAB — PROTEIN, PLEURAL OR PERITONEAL FLUID: Total protein, fluid: <3 g/dL

## 2020-07-15 LAB — GLUCOSE, PLEURAL OR PERITONEAL FLUID: Glucose, Fluid: 114 mg/dL

## 2020-07-15 LAB — LACTATE DEHYDROGENASE, PLEURAL OR PERITONEAL FLUID: LD, Fluid: 106 U/L — ABNORMAL HIGH (ref 3–23)

## 2020-07-15 NOTE — Patient Instructions (Addendum)
Right Chylothorax Today we removed 254ml of chylous fluid from your chest. Labs have been sent for the fluid.  Please call if you develop shortness of breath and/or chest pain or discomfort. If symptoms are urgent, please go to the ED for evaluation.   We will schedule a CT Chest Abdomen and Pelvis with contrast to further evaluate your chylothorax.   Return to clinic to see me in 6 months or sooner if needed.

## 2020-07-15 NOTE — Progress Notes (Signed)
.    Subjective:   PATIENT ID: Nicholas Cruz GENDER: male DOB: 15-Dec-1931, MRN: [id number]   HPI  Chief Complaint  Patient presents with  . Follow-up    Patient is here for In[facility]    Reason for Visit: Follow-up   Mr. Tee Richeson is 86 year old male who presents with atrial fibrillation on Eliquis, hypertension and hyperlipidemia who presents for follow-up of right chylothorax.  Summary: Initially seen in 12/2018 for right pleural effusion. Thoracentesis 12/30/18 with 1.3L milky fluid consistent with chylothorax Repeat thoracentesis on 02/17/19, 07/28/19 and 11/18/19 with symptomatic relief.  All cytology and cultures negative. 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   In the last two weeks, he reports worsening shortness of breath with exertion most noticeable when going to grab the newspaper in the driveway. Denies fevers, chills, unintentional weight loss. Denies chest pain, denies cough, denies wheezing, denies worsening leg edema Compliant with healthy low-fat diet that his daughter prepares nightly. He last took his Eliquis three days prior to this visit.  Social History: Former smoker. Quit in 1980. 24 pack-years No hx of thoracic surgery, radiation or any procedures except for thoracentesis  I have personally reviewed patient's past medical/family/social history, allergies, current medications.  Past Medical History:  Diagnosis Date  . Atrial fibrillation ([place]) 2008  . Colorectal polyps 1985  . Duodenal ulcer 1968  . H/O knee surgery 1990  . History of pulmonary embolism 2007  . Hyperlipidemia   . Hypertension      Family History  Problem Relation Age of Onset  . Heart attack Father   . Cancer - Lung Brother      Social History   Occupational History  . Occupation: retired  Tobacco Use  . Smoking status: Former Smoker     Packs/day: 2.00    Years: 31.00    Pack years: 62.00    Types: Cigarettes    Start date: 1949    Quit date: 12/18/1978    Years since quitting: 41.6  . Smokeless tobacco: Never Used  Vaping Use  . Vaping Use: Never used  Substance and Sexual Activity  . Alcohol use: No  . Drug use: No  . Sexual activity: Not Currently    Partners: Female    Allergies  Allergen Reactions  . Pradaxa [Dabigatran Etexilate Mesylate] Other (See Comments)    Hemoptysis     Outpatient Medications Prior to Visit  Medication Sig Dispense Refill  . alendronate (FOSAMAX) 70 MG tablet Take 1 tablet by mouth once a week.    . digoxin (LANOXIN) 0.125 MG tablet TAKE 1 TABLET BY MOUTH  DAILY 90 tablet 3  . diltiazem (CARDIZEM CD) 360 MG 24 hr capsule TAKE 1 CAPSULE BY MOUTH  DAILY 90 capsule 2  . ELIQUIS 5 MG TABS tablet TAKE 1 TABLET BY MOUTH  TWICE DAILY 180 tablet 2  . irbesartan-hydrochlorothiazide (AVALIDE) 300-12.5 MG tablet TAKE 1 TABLET BY MOUTH  DAILY 90 tablet 2  . mometasone (NASONEX) 50 MCG/ACT nasal spray Place 2 sprays into the nose daily as needed.     . Omega-3 Fatty Acids (FISH OIL) 1000 MG CAPS Take 1,000 mg by mouth 2 (two) times daily.    . pantoprazole (PROTONIX) 40 MG tablet Take 1 tablet by mouth every day 60 tablet 0  . potassium chloride SA (K-DUR,KLOR-CON) 20 MEQ tablet Take 1 tablet  by mouth every day 60 tablet 0  . pravastatin (PRAVACHOL) 80 MG tablet Take 1 tablet by mouth every day 60 tablet 0  . tamsulosin (FLOMAX) 0.4 MG CAPS capsule TAKE 1 CAPSULE BY MOUTH  DAILY. 90 capsule 1   No facility-administered medications prior to visit.    Review of Systems  Constitutional: Negative for chills, diaphoresis, fever, malaise/fatigue and weight loss.  HENT: Negative for congestion.   Respiratory: Positive for shortness of breath. Negative for cough, hemoptysis, sputum production and wheezing.   Cardiovascular: Negative for chest pain, palpitations and leg swelling.     Objective:    Vitals:   07/15/20 1003  BP: (!) 160/58  Pulse: (!) 117  Temp: 98.1 F (36.7 C)  TempSrc: Oral  SpO2: 98%  Weight: 161 lb 3.2 oz (73.1 kg)  Height: 5\' 7"  (1.702 m)   SpO2: 98 % O2 Device: None (Room air)  Physical Exam: General: Well-appearing, no acute distress HENT: [place], AT, OP clear, MMM  Eyes: EOMI, no scleral icterus Respiratory: Absent right basilar breath sounds. No crackles, wheezing or rales Cardiovascular: RRR, -M/R/G, no JVD Extremities:-Edema,-tenderness Neuro: AAO x4, CNII-XII grossly intact Psych: Normal mood, normal affect  Data Reviewed:  Imaging: CT CAP 07/28/19 - No enlarged mediastinal or hilar lymphadenopathy. Small loculated right pleural effusion. Mild emphysema CXR 07/12/20 - Recurrent small right pleural effusion CXR 07/15/20 - Post-thoracentesis. Similar right pleural effusion. No discernable pneumothorax  Bedside Ultrasound 07/15/20 Media Information   Document Information  Photos  07/15/20 right thoracentesis  07/15/2020 11:42  Attached To:  Nicholas Cruz  Source Information  Margaretha Seeds, MD  [facility]    In-Clinic Procedure   Thoracentesis Procedure Note   Pre-operative Diagnosis: Recurrent right chylothorax  Post-operative Diagnosis: same   Indications: Recurrent right chylothorax   Procedure Details  Consent: Informed consent was obtained. Risks of the procedure were discussed including: infection, bleeding, pain, pneumothorax.   Under sterile conditions the patient was positioned. Betadine solution and sterile drapes were utilized.  1% plain lidocaine was used to anesthetize the 7th right rib space. Fluid was obtained without any difficulties and minimal blood loss.  A dressing was applied to the wound and wound care instructions were provided.    Findings 200 ml of milky pleural fluid was obtained. A sample was sent to Pathology for cytology and cell counts, as well as for infection analysis.    Complications:  None; patient tolerated the procedure well.          Condition: stable   Plan A follow up chest x-ray was ordered and reviewed.No pneumothorax   Assessment & Plan:   Discussion: Right chylothorax, persistent, unclear etiology. No evidence of malignancy of prior work-up. No related trauma or radiation in patient's history. New dyspnea though doubt new/recurrent PE in setting of anticoagulation. He has previously had symptomatic relief with pleural fluid removal in the past. Performed thoracentesis in-clinic without complication. Samples of fluid sent for triglyceride, cholesterol levels, white blood cell count and differential, glucose, lactic dehydrogenase (LDH), total protein, cytology, and microbiologic smear and culture.   We have discussed risks and benefits of thoracentesis vs thoracic duct ligation vs surgery. Patient declines aggressive measures as he wishes to be available to his wife as a caregiver and prefers thoracentesis for symptomatic management. Will re-evaluate etiology of chylothorax with CT CAP which patient is amenable to.   Health Maintenance Immunization History  Administered Date(s) Administered  . Influenza Whole 09/17/2013  . Influenza, High Dose  Seasonal PF 07/26/2016, 09/19/2017, 09/06/2018, 09/02/2019  . Influenza, Seasonal, Injecte, Preservative Fre 09/17/2014  . Influenza-Unspecified 09/30/2015  . PFIZER SARS-COV-2 Vaccination 01/29/2020, 02/19/2020  . Pneumococcal Conjugate-13 07/01/2014, 09/30/2015  . Pneumococcal-Unspecified 09/02/2012  . Tdap 12/02/2010  . Zoster 11/05/2007    Orders Placed This Encounter  Procedures  . Body fluid culture    Standing Status:   Future    Number of Occurrences:   1    Standing Expiration Date:   07/15/2021  . DG Chest 2 View    Standing Status:   Future    Number of Occurrences:   1    Standing Expiration Date:   07/15/2021    Order Specific Question:   Reason for Exam (SYMPTOM  OR DIAGNOSIS  REQUIRED)    Answer:   post thora procedure    Order Specific Question:   Preferred imaging location?    Answer:   Internal    Order Specific Question:   Radiology Contrast Protocol - do NOT remove file path    Answer:   \\[email or web address]\DXFluoroContrastProtocols.pdf  . CT CHEST ABDOMEN PELVIS W CONTRAST    Standing Status:   Future    Standing Expiration Date:   07/15/2021    Order Specific Question:   Reason for Exam (SYMPTOM  OR DIAGNOSIS REQUIRED)    Answer:   Right Chylothorax    Order Specific Question:   Preferred imaging location?    Answer:   [facility]    Order Specific Question:   Radiology Contrast Protocol - do NOT remove file path    Answer:   \\[email or web address]\Radiant\CTProtocols.pdf  . Body fluid cell count with differential    Standing Status:   Future    Number of Occurrences:   1    Standing Expiration Date:   07/15/2021  . Cholesterol, body fluid    Standing Status:   Future    Number of Occurrences:   1    Standing Expiration Date:   07/15/2021  . Triglycerides, Body Fluid    Standing Status:   Future    Number of Occurrences:   1    Standing Expiration Date:   07/15/2021  . Lactate dehydrogenase, fluid - peritoneal or pleural    Standing Status:   Future    Number of Occurrences:   1    Standing Expiration Date:   07/15/2021  . Protein, pleural or peritoneal fluid    Standing Status:   Future    Number of Occurrences:   1    Standing Expiration Date:   07/15/2021  . Glucose, pleural or peritoneal fluid    Standing Status:   Future    Number of Occurrences:   1    Standing Expiration Date:   07/15/2021  . Basic Metabolic Panel (BMET)    Standing Status:   Future    Standing Expiration Date:   07/15/2021  No orders of the defined types were placed in this encounter.   Return in about 6 months (around 01/15/2021), or with me.  I have spent a total time of 30-minutes on the day of the appointment reviewing prior documentation,  coordinating care and discussing medical diagnosis and plan with the patient/family. Imaging, labs and tests included in this note have been reviewed and interpreted independently by me.  [place], MD [facility] 07/15/2020 5:01 PM  Office Number [phone]

## 2020-07-16 LAB — CYTOLOGY - NON PAP

## 2020-07-16 LAB — TRIGLYCERIDES, BODY FLUIDS: Triglycerides, Fluid: 2046 mg/dL

## 2020-07-18 LAB — CHOLESTEROL, BODY FLUID: Cholesterol, Fluid: 65 mg/dL

## 2020-07-19 ENCOUNTER — Telehealth: Payer: Self-pay | Admitting: Pulmonary Disease

## 2020-07-19 LAB — BODY FLUID CULTURE: Culture: NO GROWTH

## 2020-07-19 NOTE — Telephone Encounter (Signed)
Spoke with patient's daughter, Jeannene Patella and provided information per Dr. Melvyn Novas:  Sorry to hear that - probably should stay inside altogether and drink a normal amt of fluids and come in for f/u mbet and cxr this week to be sure not getting dehydrated) prior to ct with contrast on 812/21.   He has blood work scheduled for 07/23/20 prior to his CT scan, CXR ordered.  Pam made aware, she verbalized understanding.  Nothing further needed.

## 2020-07-19 NOTE — Telephone Encounter (Signed)
Spoke with patient's daughter, Jeannene Patella, she states that he sob is no better since his thoracentesis on 07/15/20.  He has a CT scheduled for 8/12 and blood work to be drawn here on 8/6.  She states his urine is dark, but not brown.  He is out in the heat on a tractor during the day.  Advised her to make sure he is getting enough fluids that his urine should be pale yellow to almost clear.  Advised I would get this information to one of our providers and we would call her back later today.  She verbalized understanding.  Dr. Melvyn Novas, please advise.

## 2020-07-19 NOTE — Addendum Note (Signed)
Addended by: Vanessa Barbara on: 07/19/2020 02:49 PM   Modules accepted: Orders

## 2020-07-19 NOTE — Telephone Encounter (Signed)
Sorry to hear that - probably should stay inside altogether and drink a normal amt of fluids and come in for f/u mbet and cxr this week to be sure not getting dehydrated) prior to ct with contrast on 812/21

## 2020-07-23 ENCOUNTER — Other Ambulatory Visit (INDEPENDENT_AMBULATORY_CARE_PROVIDER_SITE_OTHER): Payer: Medicare Other

## 2020-07-23 ENCOUNTER — Other Ambulatory Visit: Payer: Self-pay

## 2020-07-23 ENCOUNTER — Ambulatory Visit (INDEPENDENT_AMBULATORY_CARE_PROVIDER_SITE_OTHER): Payer: Medicare Other

## 2020-07-23 DIAGNOSIS — I898 Other specified noninfective disorders of lymphatic vessels and lymph nodes: Secondary | ICD-10-CM

## 2020-07-23 DIAGNOSIS — J940 Chylous effusion: Secondary | ICD-10-CM

## 2020-07-23 LAB — BASIC METABOLIC PANEL
BUN: 23 mg/dL (ref 6–23)
CO2: 26 mEq/L (ref 19–32)
Calcium: 8.9 mg/dL (ref 8.4–10.5)
Chloride: 104 mEq/L (ref 96–112)
Creatinine, Ser: 1.13 mg/dL (ref 0.40–1.50)
GFR: 61.36 mL/min (ref >60.00)
Glucose, Bld: 172 mg/dL — ABNORMAL HIGH (ref 70–99)
Potassium: 4.1 mEq/L (ref 3.5–5.1)
Sodium: 137 mEq/L (ref 135–145)

## 2020-07-29 ENCOUNTER — Other Ambulatory Visit: Payer: Self-pay

## 2020-07-29 ENCOUNTER — Ambulatory Visit (INDEPENDENT_AMBULATORY_CARE_PROVIDER_SITE_OTHER)
Admission: RE | Admit: 2020-07-29 | Discharge: 2020-07-29 | Disposition: A | Discharge status: Home / Self Care | Payer: Medicare Other | Source: Ambulatory Visit | Attending: Pulmonary Disease | Admitting: Pulmonary Disease

## 2020-07-29 DIAGNOSIS — J940 Chylous effusion: Secondary | ICD-10-CM

## 2020-07-29 DIAGNOSIS — I898 Other specified noninfective disorders of lymphatic vessels and lymph nodes: Secondary | ICD-10-CM

## 2020-07-29 DIAGNOSIS — N401 Enlarged prostate with lower urinary tract symptoms: Secondary | ICD-10-CM

## 2020-07-29 MED ORDER — IOHEXOL 300 MG/ML  SOLN
100.0000 mL | Freq: Once | INTRAMUSCULAR | Status: AC | PRN
  Administered 2020-07-29: 100 mL via INTRAVENOUS

## 2020-08-02 ENCOUNTER — Telehealth: Payer: Self-pay | Admitting: Pulmonary Disease

## 2020-08-02 NOTE — Telephone Encounter (Signed)
Called let pt know results , placed recall for 6 month follow up Nothing further needed at this time.

## 2020-08-03 NOTE — Progress Notes (Signed)
Called and spoke with patient about CT result per Dr Loanne Drilling. All questions answered and patient expressed full understanding. Patient aware to call us sooner if he needs anything. Nothing further needed at this time.

## 2020-08-16 NOTE — Progress Notes (Signed)
Cardiology Office Note   Date:  08/17/2020   ID:  Nicholas Cruz, DOB October 31, 1931, MRN [id number]  PCP:  Nicholas Huddle, MD    No chief complaint on file.  AFib  Wt Readings from Last 3 Encounters:  08/17/20 162 lb (73.5 kg)  07/15/20 161 lb 3.2 oz (73.1 kg)  07/12/20 163 lb 3.2 oz (74 kg)       History of Present Illness: Nicholas Cruz is a 86 y.o. male  who has had atrial fibrillation. He was on an antiarrhythmic in the past. Now Rate controlled.  Eliquis for stroke prevention.   His wife has Alzheimer's disease- although she does not admit it. He is a primary caretaker for her.THis is a source of stress. She has gotten worse since the last visit.  He had an echo in Jan 2020 at Dr. Cordelia Cruz office showing dilated atria and was referred back for evaluation.  LV function was normal.   He had some fluid removed from his right lung from VIR.  He felt much better after this procedure a few years ago.  Since the last visit, he has done well.  He walks regularly.    Denies : Chest pain. Dizziness. Leg edema. Nitroglycerin use. Orthopnea. Palpitations. Paroxysmal nocturnal dyspnea. Syncope.   Mild DOE- chronic.  Correlates to when he needs thoracentesis.  Had a tap after 1 year.   No bleeding issues.  No falls recently.    Past Medical History:  Diagnosis Date  . Atrial fibrillation ([place]) 2008  . Colorectal polyps 1985  . Duodenal ulcer 1968  . H/O knee surgery 1990  . History of pulmonary embolism 2007  . Hyperlipidemia   . Hypertension     Past Surgical History:  Procedure Laterality Date  . IR THORACENTESIS ASP PLEURAL SPACE W/IMG GUIDE  12/30/2018  . IR THORACENTESIS ASP PLEURAL SPACE W/IMG GUIDE  02/17/2019  . IR THORACENTESIS ASP PLEURAL SPACE W/IMG GUIDE  07/28/2019  . KNEE SURGERY  1990   [facility]     Current Outpatient Medications  Medication Sig Dispense Refill  . alendronate (FOSAMAX) 70 MG tablet Take 1 tablet by mouth once a week.    . digoxin  (LANOXIN) 0.125 MG tablet TAKE 1 TABLET BY MOUTH  DAILY 90 tablet 3  . diltiazem (CARDIZEM CD) 360 MG 24 hr capsule TAKE 1 CAPSULE BY MOUTH  DAILY 90 capsule 2  . ELIQUIS 5 MG TABS tablet TAKE 1 TABLET BY MOUTH  TWICE DAILY 180 tablet 2  . irbesartan-hydrochlorothiazide (AVALIDE) 300-12.5 MG tablet TAKE 1 TABLET BY MOUTH  DAILY 90 tablet 2  . mometasone (NASONEX) 50 MCG/ACT nasal spray Place 2 sprays into the nose daily as needed.     . Omega-3 Fatty Acids (FISH OIL) 1000 MG CAPS Take 1,000 mg by mouth 2 (two) times daily.    . pantoprazole (PROTONIX) 40 MG tablet Take 1 tablet by mouth every day 60 tablet 0  . potassium chloride SA (K-DUR,KLOR-CON) 20 MEQ tablet Take 1 tablet by mouth every day 60 tablet 0  . pravastatin (PRAVACHOL) 80 MG tablet Take 1 tablet by mouth every day 60 tablet 0  . tamsulosin (FLOMAX) 0.4 MG CAPS capsule TAKE 1 CAPSULE BY MOUTH  DAILY. 90 capsule 1   No current facility-administered medications for this visit.    Allergies:   Pradaxa [dabigatran etexilate mesylate]    Social History:  The patient  reports that he quit smoking about 41 years ago. His smoking  use included cigarettes. He started smoking about 72 years ago. He has a 62.00 pack-year smoking history. He has never used smokeless tobacco. He reports that he does not drink alcohol and does not use drugs.   Family History:  The patient's family history includes Cancer - Lung in his brother; Heart attack in his father.    ROS:  Please see the history of present illness.   Otherwise, review of systems are positive for DOE.   All other systems are reviewed and negative.    PHYSICAL EXAM: VS:  BP 138/60   Pulse 83   Ht 5\' 7"  (1.702 m)   Wt 162 lb (73.5 kg)   SpO2 98%   BMI 25.37 kg/m  , BMI Body mass index is 25.37 kg/m. GEN: Well nourished, well developed, in no acute distress  HEENT: normal  Neck: no JVD, carotid bruits, or masses Cardiac: irregularly irregular; no murmurs, rubs, or gallops,no  edema  Respiratory:  clear to auscultation bilaterally, normal work of breathing GI: soft, nontender, nondistended, + BS MS: no deformity or atrophy  Skin: warm and dry, no rash Neuro:  Strength and sensation are intact Psych: euthymic mood, full affect   EKG:   The ekg ordered today demonstrates AFib, rate controlled   Recent Labs: 11/18/2019: Hemoglobin 14.5; Platelets 204.0 07/23/2020: BUN 23; Creatinine, Ser 1.13; Potassium 4.1; Sodium 137   Lipid Panel    Component Value Date/Time   CHOL 179 11/18/2019 1536   TRIG 226 (H) 11/18/2019 1536   HDL 37 (L) 03/28/2016 0728   CHOLHDL 4.9 03/28/2016 0728   VLDL 36 (H) 03/28/2016 0728   LDLCALC 107 03/28/2016 0728   LDLDIRECT 86.0 05/13/2015 0739     Other studies Reviewed: Additional studies/ records that were reviewed today with results demonstrating: .   ASSESSMENT AND PLAN:  1. AFib: Rate controlled.  COntinue current meds. Low salt diet.  Daughter brings dinner every night. 2. Anticoagulated: No bleeding problems.  Hbg 14.6 in June 2021 3. HTN: The current medical regimen is effective;  continue present plan and medications.  Improved since 10/2019.  4. Hyperlipidemia: LDL 85 .  COntinue pravastatin. 5. Dilated atria: Noted by prior echocardiogram.    Current medicines are reviewed at length with the patient today.  The patient concerns regarding his medicines were addressed.  The following changes have been made:  No change  Labs/ tests ordered today include:  No orders of the defined types were placed in this encounter.   Recommend 150 minutes/week of aerobic exercise Low fat, low carb, high fiber diet recommended  Disposition:   FU in 1 year   Signed, Larae Grooms, MD  08/17/2020 2:04 PM    [facility] [address], [place], [place]  [address] Phone: [phone]; Fax: [phone]

## 2020-08-17 ENCOUNTER — Ambulatory Visit: Payer: Medicare Other | Admitting: Interventional Cardiology

## 2020-08-17 ENCOUNTER — Encounter: Payer: Self-pay | Admitting: Interventional Cardiology

## 2020-08-17 ENCOUNTER — Other Ambulatory Visit: Payer: Self-pay

## 2020-08-17 VITALS — BP 138/60 | HR 83 | Ht 67.0 in | Wt 162.0 lb

## 2020-08-17 DIAGNOSIS — E782 Mixed hyperlipidemia: Secondary | ICD-10-CM

## 2020-08-17 DIAGNOSIS — I10 Essential (primary) hypertension: Secondary | ICD-10-CM | POA: Diagnosis not present

## 2020-08-17 DIAGNOSIS — Z7901 Long term (current) use of anticoagulants: Secondary | ICD-10-CM | POA: Diagnosis not present

## 2020-08-17 DIAGNOSIS — I4811 Longstanding persistent atrial fibrillation: Secondary | ICD-10-CM | POA: Diagnosis not present

## 2020-08-17 NOTE — Patient Instructions (Signed)

## 2020-11-29 ENCOUNTER — Other Ambulatory Visit: Payer: Self-pay | Admitting: Interventional Cardiology

## 2020-11-30 NOTE — Telephone Encounter (Signed)
Pt last saw Dr Irish Lack 08/17/20, last labs 07/23/20 Creat 1.13, age 86, weight 73.5kg, based on specified criteria pt is on appropriate dosage of Eliquis 5mg  BID.  Will refill rx.

## 2021-01-19 ENCOUNTER — Other Ambulatory Visit: Payer: Self-pay | Admitting: Physician Assistant

## 2021-01-28 ENCOUNTER — Other Ambulatory Visit: Payer: Self-pay

## 2021-01-28 ENCOUNTER — Ambulatory Visit: Payer: Medicare Other | Admitting: Pulmonary Disease

## 2021-01-28 ENCOUNTER — Ambulatory Visit (INDEPENDENT_AMBULATORY_CARE_PROVIDER_SITE_OTHER): Payer: Medicare Other

## 2021-01-28 ENCOUNTER — Telehealth: Payer: Self-pay

## 2021-01-28 ENCOUNTER — Encounter: Payer: Self-pay | Admitting: Pulmonary Disease

## 2021-01-28 VITALS — BP 126/84 | HR 63 | Temp 98.3°F | Ht 67.0 in | Wt 166.0 lb

## 2021-01-28 DIAGNOSIS — R0602 Shortness of breath: Secondary | ICD-10-CM | POA: Diagnosis not present

## 2021-01-28 DIAGNOSIS — J90 Pleural effusion, not elsewhere classified: Secondary | ICD-10-CM | POA: Diagnosis not present

## 2021-01-28 DIAGNOSIS — M7989 Other specified soft tissue disorders: Secondary | ICD-10-CM | POA: Diagnosis not present

## 2021-01-28 DIAGNOSIS — I517 Cardiomegaly: Secondary | ICD-10-CM | POA: Diagnosis not present

## 2021-01-28 DIAGNOSIS — R059 Cough, unspecified: Secondary | ICD-10-CM | POA: Diagnosis not present

## 2021-01-28 DIAGNOSIS — J940 Chylous effusion: Secondary | ICD-10-CM

## 2021-01-28 NOTE — Progress Notes (Unsigned)
.    Subjective:   PATIENT ID: Nicholas Cruz GENDER: male DOB: 1931-10-09, MRN: [id number]   HPI  Chief Complaint  Patient presents with  . Follow-up    Sob has been worse since last visit. Nothing makes the Sob worse or better. Occasional dry cough.     Reason for Visit: Follow-up   [name] is 87 year old male who presents with atrial fibrillation on Eliquis, hypertension and hyperlipidemia who presents for follow-up of right chylothorax.  Summary: Initially seen in 12/2018 for right pleural effusion. Thoracentesis 12/30/18 with 1.3L milky fluid consistent with chylothorax Repeat thoracentesis on 02/17/19, 07/28/19 and 11/18/19 with symptomatic relief.  Last thoracentesis 07/15/20. All cytology and cultures negative. 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   In the last two to three weeks he had had worsening shortness of breath. Worsens with exertion. Denies fevers, chills, cough, wheezing, unintentional weight loss. Denies chest discomfort or pain. He reports his knees are swollen right the right being larger. Denies tenderness or restriction of range of motion. His last thoracentesis was 7 months ago.  Social History: Former smoker. Quit in 1980. 24 pack-years No hx of thoracic surgery, radiation or any procedures except for thoracentesis  I have personally reviewed patient's past medical/family/social history/allergies/current medications.  Past Medical History:  Diagnosis Date  . Atrial fibrillation ([place]) 2008  . Colorectal polyps 1985  . Duodenal ulcer 1968  . H/O knee surgery 1990  . History of pulmonary embolism 2007  . Hyperlipidemia   . Hypertension     Allergies  Allergen Reactions  . Pradaxa [Dabigatran Etexilate Mesylate] Other (See Comments)    Hemoptysis    Outpatient Medications Prior to Visit  Medication Sig  Dispense Refill  . alendronate (FOSAMAX) 70 MG tablet Take 1 tablet by mouth once a week.    . digoxin (LANOXIN) 0.125 MG tablet TAKE 1 TABLET BY MOUTH  DAILY 90 tablet 2  . diltiazem (CARDIZEM CD) 360 MG 24 hr capsule TAKE 1 CAPSULE BY MOUTH  DAILY 90 capsule 1  . ELIQUIS 5 MG TABS tablet TAKE 1 TABLET BY MOUTH  TWICE DAILY 180 tablet 1  . irbesartan-hydrochlorothiazide (AVALIDE) 300-12.5 MG tablet TAKE 1 TABLET BY MOUTH  DAILY 90 tablet 2  . mometasone (NASONEX) 50 MCG/ACT nasal spray Place 2 sprays into the nose daily as needed.     . pantoprazole (PROTONIX) 40 MG tablet Take 1 tablet by mouth every day 60 tablet 0  . pravastatin (PRAVACHOL) 80 MG tablet Take 1 tablet by mouth every day 60 tablet 0  . tamsulosin (FLOMAX) 0.4 MG CAPS capsule TAKE 1 CAPSULE BY MOUTH  DAILY. 90 capsule 1  . Omega-3 Fatty Acids (FISH OIL) 1000 MG CAPS Take 1,000 mg by mouth 2 (two) times daily. (Patient not taking: Reported on 01/28/2021)    . potassium chloride SA (K-DUR,KLOR-CON) 20 MEQ tablet Take 1 tablet by mouth every day (Patient not taking: Reported on 01/28/2021) 60 tablet 0   No facility-administered medications prior to visit.    Review of Systems  Constitutional: Negative for chills, diaphoresis, fever, malaise/fatigue and weight loss.  HENT: Negative for congestion.   Respiratory: Positive for shortness of breath. Negative for cough, hemoptysis, sputum production and wheezing.   Cardiovascular: Negative for chest pain, palpitations and leg swelling.     Objective:   Vitals:   01/28/21  1405  BP: 126/84  Pulse: 63  Temp: 98.3 F (36.8 C)  SpO2: 97%  Weight: 166 lb (75.3 kg)  Height: 5\' 7"  (1.702 m)   SpO2: 97 % O2 Device: None (Room air)  Physical Exam: General: Well-appearing, no acute distress HENT: [place], AT Eyes: EOMI, no scleral icterus Respiratory: Diminished right basilar breath sounds bilaterally. Normal left air entry No crackles, wheezing or rales Cardiovascular: RRR,  -M/R/G, no JVD Extremities:-Edema,-tenderness, swollen knee bilaterally Neuro: AAO x4, CNII-XII grossly intact Skin: Intact, no rashes or bruising Psych: Normal mood, normal affect  Data Reviewed:  Imaging: CT CAP 07/28/19 - No enlarged mediastinal or hilar lymphadenopathy. Small loculated right pleural effusion. Mild emphysema CXR 07/12/20 - Recurrent small right pleural effusion CXR 07/15/20 - Post-thoracentesis. Similar right pleural effusion. No discernable pneumothorax CXR 01/28/21 - Small right pleural effusion.   Imaging, labs and test noted above have been reviewed independently by me.  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  Shortness of breath - worsening. Likely related to effusion Right chylothorax, persistent, unclear etiology. No evidence of malignancy on prior work-up. No related trauma or radiation in patient's history. New onset dyspnea in the last two weeks. Previously had symptomatic relief with thoracentesis. Discussed risks and benefits of thoracentesis. Will arrange for thoracentesis on 02/01/21. Previously offered more aggressive treatment in the past including thoracic duct ligation vs surgery. Will re-evaluate etiology of chylothorax with CT CAP which patient is amenable to.  Plan Thoracentesis on 02/02/20. Patient instructed to hold Eliquis 48 hours prior to procedure.  Health Maintenance Immunization History  Administered Date(s) Administered  . Influenza Whole 09/17/2013  . Influenza, High Dose Seasonal PF 07/26/2016, 09/19/2017, 09/06/2018, 09/02/2019  . Influenza, Seasonal, Injecte, Preservative Fre 09/17/2014  . Influenza-Unspecified 09/30/2015  . PFIZER(Purple Top)SARS-COV-2 Vaccination 01/29/2020, 02/19/2020, 10/03/2020  . Pneumococcal Conjugate-13 07/01/2014, 09/30/2015  . Pneumococcal-Unspecified 09/02/2012  . Tdap 12/02/2010  . Zoster 11/05/2007    Orders  Placed This Encounter  Procedures  . DG Chest 2 View    Standing Status:   Future    Number of Occurrences:   1    Standing Expiration Date:   01/28/2022    Order Specific Question:   Reason for Exam (SYMPTOM  OR DIAGNOSIS REQUIRED)    Answer:   SOB,cough    Order Specific Question:   Preferred imaging location?    Answer:   Internal  . DG Chest 2 View    Standing Status:   Future    Number of Occurrences:   1    Standing Expiration Date:   01/28/2022    Order Specific Question:   Reason for Exam (SYMPTOM  OR DIAGNOSIS REQUIRED)    Answer:   pleural effusion    Order Specific Question:   Preferred imaging location?    Answer:   Internal  . CHG X-RAY KNEE BILAT STANDING    Bilateral knee x-ray- knee swelling on anti coagulation  No orders of the defined types were placed in this encounter.   Return in about 4 days (around 02/01/2021).  I have spent a total time of 31-minutes on the day of the appointment reviewing prior documentation, coordinating care and discussing medical diagnosis and plan with the patient/family. Imaging, labs and tests included in this note have been reviewed and interpreted independently by me.  [place], MD [facility] 01/28/2021 2:18 PM  Office Number [phone]

## 2021-01-28 NOTE — Patient Instructions (Addendum)
Right pleural effusion/chylothorax --HOLD Eliquis on Sunday and Monday prior to your thoracentesis. --Will schedule Feb 15 at 10 am with me

## 2021-02-01 ENCOUNTER — Encounter: Payer: Self-pay | Admitting: Pulmonary Disease

## 2021-02-01 ENCOUNTER — Ambulatory Visit: Payer: Medicare Other | Admitting: Pulmonary Disease

## 2021-02-01 ENCOUNTER — Ambulatory Visit (INDEPENDENT_AMBULATORY_CARE_PROVIDER_SITE_OTHER): Payer: Medicare Other

## 2021-02-01 ENCOUNTER — Ambulatory Visit: Payer: Medicare Other

## 2021-02-01 ENCOUNTER — Other Ambulatory Visit (HOSPITAL_COMMUNITY)
Admission: RE | Admit: 2021-02-01 | Discharge: 2021-02-01 | Disposition: A | Discharge status: Home / Self Care | Payer: Medicare Other | Source: Ambulatory Visit | Attending: Pulmonary Disease | Admitting: Pulmonary Disease

## 2021-02-01 ENCOUNTER — Encounter (INDEPENDENT_AMBULATORY_CARE_PROVIDER_SITE_OTHER): Payer: Medicare Other | Admitting: Pulmonary Disease

## 2021-02-01 ENCOUNTER — Other Ambulatory Visit: Payer: Self-pay

## 2021-02-01 VITALS — BP 118/76 | HR 91 | Temp 97.9°F | Ht 67.0 in | Wt 166.0 lb

## 2021-02-01 DIAGNOSIS — J940 Chylous effusion: Secondary | ICD-10-CM

## 2021-02-01 DIAGNOSIS — J948 Other specified pleural conditions: Secondary | ICD-10-CM | POA: Diagnosis not present

## 2021-02-01 DIAGNOSIS — J90 Pleural effusion, not elsewhere classified: Secondary | ICD-10-CM | POA: Diagnosis not present

## 2021-02-01 NOTE — Telephone Encounter (Signed)
ATC x 1 about a knee xray Dr. Loanne Drilling had added to the AVS after pt left office. Left voicemail. Advised to return call to the office.

## 2021-02-01 NOTE — Progress Notes (Signed)
.    Subjective:   PATIENT ID: Nicholas Cruz GENDER: male DOB: 1931/09/06, MRN: [id number]   HPI  Chief Complaint  Patient presents with  . Follow-up    thoracentesis    Reason for Visit: Follow-up   [name] is 87 year old male with atrial fibrillation on Eliquis who presents for follow-up for right chylothorax.   Summary: Initially seen in 12/2018 for right pleural effusion. Thoracentesis 12/30/18 with 1.3L milky fluid consistent with chylothorax Repeat thoracentesis on 02/17/19, 07/28/19 and 11/18/19, 06/2020 with symptomatic relief.  All cytology and cultures negative. 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   He presents for thoracentesis for right pleural effusion suspected to be related to his recurrent chylothorax. Denies fevers or chills. Has shortness of breath. Last Eliquis was taken on 01/28/21 per patient even though he was instructed to hold his Eliquis on 01/30/21. States he eats a good diet, not sure if low fat as his family prepares his meals.  Social History: Former smoker. Quit in 1980. 24 pack-years No hx of thoracic surgery, radiation or any procedures except for thoracentesis  I have personally reviewed patient's past medical/family/social history/allergies/current medications.  Past Medical History:  Diagnosis Date  . Atrial fibrillation ([place]) 2008  . Colorectal polyps 1985  . Duodenal ulcer 1968  . H/O knee surgery 1990  . History of pulmonary embolism 2007  . Hyperlipidemia   . Hypertension     Allergies  Allergen Reactions  . Pradaxa [Dabigatran Etexilate Mesylate] Other (See Comments)    Hemoptysis     Outpatient Medications Prior to Visit  Medication Sig Dispense Refill  . alendronate (FOSAMAX) 70 MG tablet Take 1 tablet by mouth once a week.    . digoxin (LANOXIN) 0.125 MG tablet TAKE 1 TABLET BY MOUTH   DAILY 90 tablet 2  . diltiazem (CARDIZEM CD) 360 MG 24 hr capsule TAKE 1 CAPSULE BY MOUTH  DAILY 90 capsule 1  . irbesartan-hydrochlorothiazide (AVALIDE) 300-12.5 MG tablet TAKE 1 TABLET BY MOUTH  DAILY 90 tablet 2  . mometasone (NASONEX) 50 MCG/ACT nasal spray Place 2 sprays into the nose daily as needed.     . Omega-3 Fatty Acids (FISH OIL) 1000 MG CAPS Take 1,000 mg by mouth 2 (two) times daily.    . pantoprazole (PROTONIX) 40 MG tablet Take 1 tablet by mouth every day 60 tablet 0  . potassium chloride SA (K-DUR,KLOR-CON) 20 MEQ tablet Take 1 tablet by mouth every day 60 tablet 0  . pravastatin (PRAVACHOL) 80 MG tablet Take 1 tablet by mouth every day 60 tablet 0  . tamsulosin (FLOMAX) 0.4 MG CAPS capsule TAKE 1 CAPSULE BY MOUTH  DAILY. 90 capsule 1  . ELIQUIS 5 MG TABS tablet TAKE 1 TABLET BY MOUTH  TWICE DAILY (Patient not taking: Reported on 02/01/2021) 180 tablet 1   No facility-administered medications prior to visit.    Review of Systems  Constitutional: Negative for chills, diaphoresis, fever, malaise/fatigue and weight loss.  HENT: Negative for congestion.   Respiratory: Positive for shortness of breath. Negative for cough, hemoptysis, sputum production and wheezing.   Cardiovascular: Negative for chest pain, palpitations and leg swelling.     Objective:   Vitals:   02/01/21 1544  BP: 118/76  Pulse: 91  Temp: 97.9 F (36.6 C)  SpO2: 97%  Weight: 166 lb (75.3 kg)  Height:  5\' 7"  (1.702 m)   SpO2: 97 % O2 Device: None (Room air)  Physical Exam: General: Well-appearing, no acute distress HENT: [place], AT Eyes: EOMI, no scleral icterus Respiratory: Diminished right basilar breath sounds. Normal left air entry. No crackles, wheezing or rales Cardiovascular: RRR, -M/R/G, no JVD Neuro: AAO x4, CNII-XII grossly intact Skin: Intact, no rashes or bruising  Data Reviewed:  Imaging: CT CAP 07/28/19 - No enlarged mediastinal or hilar lymphadenopathy. Small loculated right  pleural effusion. Mild emphysema CXR 07/12/20 - Recurrent small right pleural effusion CXR 07/15/20 - Post-thoracentesis. Similar right pleural effusion. No discernable pneumothorax CXR 01/28/21 - Unchanged small right pleural effusion CXR 02/01/21 -   In-Clinic Procedure   Thoracentesis  Procedure Note  Date and Time:  02/01/2021  3:49 PM  Provider Performing:Geoffrey Hynes Rodman Pickle   Procedure: Thoracentesis with imaging guidance [id number])  Indication(s) Right Pleural Effusion  Consent Risks of the procedure as well as the alternatives and risks of each were explained to the patient and/or caregiver.  Consent for the procedure was obtained and is signed in the bedside chart  Anesthesia Topical only with 1% lidocaine    Time Out Verified patient identification, verified procedure, site/side was marked, verified correct patient position, special equipment/implants available, medications/allergies/relevant history reviewed, required imaging and test results available.   Sterile Technique Maximal sterile technique including full sterile barrier drape, hand hygiene, sterile gown, sterile gloves, mask, hair covering, sterile ultrasound probe cover (if used).  Procedure Description Ultrasound was used to identify appropriate pleural anatomy for placement and overlying skin marked.  Area of drainage cleaned and draped in sterile fashion. Lidocaine was used to anesthetize the skin and subcutaneous tissue.  250 cc's of milky appearing fluid was drained from the right pleural space between 7th and 8th intercostal space. Catheter then removed and bandaid applied to site.      Complications/Tolerance None; patient tolerated the procedure well. Chest X-ray is ordered to confirm no post-procedural complication.   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  Shortness of breath improved post-procedure.  Right chylothorax, persistent, unclear etiology. Cytology negative on prior thoracentesis. We have discussed further work-up for malignancy including imaging. Patient does not wish to be aggressive with care regarding to the effusion. His last thoracentesis was 7 months ago. No related trauma or radiation in patient's history. Samples of fluid sent for triglyceride, cholesterol levels, white blood cell count and differential, glucose, lactic dehydrogenase (LDH), total protein, cytology, and microbiologic smear and culture.   Thoracentesis performed in-clinic today with removal of 250cc of milky pleural fluid.   Health Maintenance Immunization History  Administered Date(s) Administered  . Influenza Whole 09/17/2013  . Influenza, High Dose Seasonal PF 07/26/2016, 09/19/2017, 09/06/2018, 09/02/2019  . Influenza, Seasonal, Injecte, Preservative Fre 09/17/2014  . Influenza-Unspecified 09/30/2015  . PFIZER(Purple Top)SARS-COV-2 Vaccination 01/29/2020, 02/19/2020, 10/03/2020  . Pneumococcal Conjugate-13 07/01/2014, 09/30/2015  . Pneumococcal-Unspecified 09/02/2012  . Tdap 12/02/2010  . Zoster 11/05/2007    Orders Placed This Encounter  Procedures  . DG Chest 2 View    Standing Status:   Future    Standing Expiration Date:   02/01/2022    Order Specific Question:   Reason for Exam (SYMPTOM  OR DIAGNOSIS REQUIRED)    Answer:   Thoracentesis    Order Specific Question:   Preferred imaging location?    Answer:   Internal  No orders of the defined types were placed in this encounter.   Return in about 6 months (around 08/01/2021).  I have spent a total time of 25-minutes on the day of the appointment reviewing prior documentation, coordinating care and discussing medical diagnosis and plan with the patient/family. Imaging, labs and tests included in this note have been reviewed and interpreted independently by me.   [place], MD [facility] 02/01/2021 4:35 PM  Office Number [phone]

## 2021-02-01 NOTE — Patient Instructions (Signed)
RESTART your Eliquis TOMORROW MORNING (2/16) Follow-up with me in 6 months

## 2021-02-02 ENCOUNTER — Other Ambulatory Visit (HOSPITAL_COMMUNITY)
Admission: RE | Admit: 2021-02-02 | Discharge: 2021-02-02 | Disposition: A | Discharge status: Home / Self Care | Payer: Medicare Other | Source: Other Acute Inpatient Hospital | Attending: Pulmonary Disease | Admitting: Pulmonary Disease

## 2021-02-02 ENCOUNTER — Other Ambulatory Visit: Payer: Self-pay | Admitting: Pulmonary Disease

## 2021-02-02 DIAGNOSIS — J940 Chylous effusion: Secondary | ICD-10-CM

## 2021-02-02 DIAGNOSIS — E781 Pure hyperglyceridemia: Secondary | ICD-10-CM | POA: Diagnosis present

## 2021-02-02 LAB — BODY FLUID CELL COUNT WITH DIFFERENTIAL
Lymphs, Fluid: 96 %
Monocyte-Macrophage-Serous Fluid: 4 % — ABNORMAL LOW (ref 50–90)
Total Nucleated Cell Count, Fluid: 164 cu mm (ref 0–1000)

## 2021-02-02 LAB — GLUCOSE, PLEURAL OR PERITONEAL FLUID: Glucose, Fluid: 115 mg/dL

## 2021-02-02 LAB — PROTEIN, PLEURAL OR PERITONEAL FLUID: Total protein, fluid: <3 g/dL

## 2021-02-02 LAB — LACTATE DEHYDROGENASE, PLEURAL OR PERITONEAL FLUID: LD, Fluid: 149 U/L — ABNORMAL HIGH (ref 3–23)

## 2021-02-03 LAB — CYTOLOGY - NON PAP

## 2021-02-03 LAB — SURGICAL PATHOLOGY

## 2021-02-03 LAB — TRIGLYCERIDES, BODY FLUIDS: Triglycerides, Fluid: 3330 mg/dL

## 2021-02-05 LAB — BODY FLUID CULTURE W GRAM STAIN
Culture: NO GROWTH
Gram Stain: NONE SEEN

## 2021-02-06 LAB — CHOLESTEROL, BODY FLUID: Cholesterol, Fluid: 88 mg/dL

## 2021-02-16 NOTE — Progress Notes (Signed)
Patient encounter rescheduled to afternoon slot. No charge on this encounter.

## 2021-05-24 ENCOUNTER — Telehealth: Payer: Self-pay | Admitting: *Deleted

## 2021-05-24 NOTE — Telephone Encounter (Signed)
This will be OK

## 2021-05-24 NOTE — Telephone Encounter (Signed)
Received a fax from [place] that the manufacturer of the generic Digoxin is changing from [place] to [place].  They are wanting a ok for this to take place. They will also reach out to the pt for approval as well.  Will route to Enis Slipper, RN of Dr. Irish Lack to see if this is ok.  Pat, please route back to the refill pool.  Thanks!

## 2021-05-25 NOTE — Telephone Encounter (Signed)
Called [facility] mail order pharmacy to inform them that is was ok to change manufacturer from [place] to [place]. Pharmacist verbalized understanding.

## 2021-05-25 NOTE — Telephone Encounter (Signed)
Dr Irish Lack would like to check digoxin level a month after changing to new manufacturer.  Patient will let us know when he makes change to digoxin manufactured by [employer] so lab work can be scheduled.

## 2021-06-24 DIAGNOSIS — I2510 Atherosclerotic heart disease of native coronary artery without angina pectoris: Secondary | ICD-10-CM | POA: Diagnosis not present

## 2021-06-24 DIAGNOSIS — E0841 Diabetes mellitus due to underlying condition with diabetic mononeuropathy: Secondary | ICD-10-CM | POA: Diagnosis not present

## 2021-06-24 DIAGNOSIS — I10 Essential (primary) hypertension: Secondary | ICD-10-CM | POA: Diagnosis not present

## 2021-06-24 DIAGNOSIS — E1165 Type 2 diabetes mellitus with hyperglycemia: Secondary | ICD-10-CM | POA: Diagnosis not present

## 2021-06-24 DIAGNOSIS — J90 Pleural effusion, not elsewhere classified: Secondary | ICD-10-CM | POA: Diagnosis not present

## 2021-06-24 DIAGNOSIS — I4891 Unspecified atrial fibrillation: Secondary | ICD-10-CM | POA: Diagnosis not present

## 2021-06-24 DIAGNOSIS — D6869 Other thrombophilia: Secondary | ICD-10-CM | POA: Diagnosis not present

## 2021-06-24 DIAGNOSIS — M810 Age-related osteoporosis without current pathological fracture: Secondary | ICD-10-CM | POA: Diagnosis not present

## 2021-06-24 DIAGNOSIS — E559 Vitamin D deficiency, unspecified: Secondary | ICD-10-CM | POA: Diagnosis not present

## 2021-06-28 ENCOUNTER — Telehealth: Payer: Self-pay | Admitting: Pulmonary Disease

## 2021-06-28 NOTE — Telephone Encounter (Signed)
Please let patient know I have availability to see him tomorrow for ultrasound in clinic tomorrow 06/29/21 at 12:30 PM

## 2021-06-28 NOTE — Telephone Encounter (Signed)
Called and spoke with patient. He stated that his niece will bring him tomorrow at 1230pm. He also stated that his PCP did a CXR about 3-4 days ago and the CXR showed fluid. I advised him I would contact Dr. Inda Merlin' office for a report of the CXR.   Called Dr. Inda Merlin' office to get a copy of the CXR. Spoke with Vicente Males in medical records. She stated that they did not have any recent CXRs for him.   Nothing further needed.

## 2021-06-28 NOTE — Telephone Encounter (Signed)
Call made to patient, confirmed DOB. Patient states he has been more SOB for last month. He states he was unable to find our number. Reports a minimal dry cough. Denies chest pain. Denies any allergy symptoms. He reports he is taking his allergy pill daily. Patient denies any other symptoms stating she told me to call I if I was more SOB so I wanted to see if I needed to be seen sooner.   JE please advise. Patient has appt 8/22. Denies any other symptoms.

## 2021-06-29 ENCOUNTER — Ambulatory Visit: Payer: Medicare Other | Admitting: Pulmonary Disease

## 2021-06-29 ENCOUNTER — Encounter: Payer: Self-pay | Admitting: Pulmonary Disease

## 2021-06-29 ENCOUNTER — Other Ambulatory Visit: Payer: Self-pay

## 2021-06-29 VITALS — BP 132/50 | HR 78 | Temp 98.0°F | Ht 67.0 in | Wt 163.8 lb

## 2021-06-29 DIAGNOSIS — J940 Chylous effusion: Secondary | ICD-10-CM

## 2021-06-29 NOTE — Patient Instructions (Signed)
Right chylothorax --Plan for right thoracentesis on 07/05/21 at 3pm --STOP Eliquis until procedure date

## 2021-06-29 NOTE — Progress Notes (Signed)
.    Subjective:   PATIENT ID: Nicholas Cruz GENDER: male DOB: [birth year], MRN: [id number]   HPI  Chief Complaint  Patient presents with   Follow-up    Patient is here to check on pleural effusion on right. Patient has been short of breath for almost 2 months with exertion    Reason for Visit: Follow-up   [name] is 87 year old male with atrial fibrillation on Eliquis who presents for follow-up for right chylothorax.   Summary: Initially seen in 12/2018 for right pleural effusion. Thoracentesis 12/30/18 with 1.3L milky fluid consistent with chylothorax Repeat thoracentesis on 02/17/19, 07/28/19 and 11/18/19, 06/2020, 01/2021 with symptomatic relief.  All cytology and cultures negative. 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   Today he presents for three week history of gradually worsening shortness of breath similar to his prior episodes of recurrent chylothorax. Denies wheezing, cough, hemoptysis. No fever, chills or recent sick contacts. His niece is his driver since his vision has worsened.  Social History: Former smoker. Quit in 1980. 24 pack-years No hx of thoracic surgery, radiation or any procedures except for thoracentesis  I have personally reviewed patient's past medical/family/social history/allergies/current medications.  Past Medical History:  Diagnosis Date   Atrial fibrillation ([place]) 2008   Colorectal polyps 1985   Duodenal ulcer 1968   H/O knee surgery 1990   History of pulmonary embolism 2007   Hyperlipidemia    Hypertension     Allergies  Allergen Reactions   Pradaxa [Dabigatran Etexilate Mesylate] Other (See Comments)    Hemoptysis     Outpatient Medications Prior to Visit  Medication Sig Dispense Refill   alendronate (FOSAMAX) 70 MG tablet Take 1 tablet by mouth once a week.     digoxin (LANOXIN) 0.125 MG  tablet TAKE 1 TABLET BY MOUTH  DAILY 90 tablet 2   diltiazem (CARDIZEM CD) 360 MG 24 hr capsule TAKE 1 CAPSULE BY MOUTH  DAILY 90 capsule 1   ELIQUIS 5 MG TABS tablet TAKE 1 TABLET BY MOUTH  TWICE DAILY 180 tablet 1   irbesartan-hydrochlorothiazide (AVALIDE) 300-12.5 MG tablet TAKE 1 TABLET BY MOUTH  DAILY 90 tablet 2   mometasone (NASONEX) 50 MCG/ACT nasal spray Place 2 sprays into the nose daily as needed.      Omega-3 Fatty Acids (FISH OIL) 1000 MG CAPS Take 1,000 mg by mouth 2 (two) times daily.     pantoprazole (PROTONIX) 40 MG tablet Take 1 tablet by mouth every day 60 tablet 0   potassium chloride SA (K-DUR,KLOR-CON) 20 MEQ tablet Take 1 tablet by mouth every day 60 tablet 0   pravastatin (PRAVACHOL) 80 MG tablet Take 1 tablet by mouth every day 60 tablet 0   tamsulosin (FLOMAX) 0.4 MG CAPS capsule TAKE 1 CAPSULE BY MOUTH  DAILY. 90 capsule 1   No facility-administered medications prior to visit.    Review of Systems  Constitutional:  Negative for chills, diaphoresis, fever, malaise/fatigue and weight loss.  HENT:  Negative for congestion.   Respiratory:  Positive for shortness of breath. Negative for cough, hemoptysis, sputum production and wheezing.   Cardiovascular:  Negative for chest pain, palpitations and leg swelling.    Objective:   Vitals:   06/29/21 1237  BP: (!) 132/50  Pulse: 78  Temp: 98 F (36.7 C)  TempSrc: Oral  SpO2: 98%  Weight: 163  lb 12.8 oz (74.3 kg)  Height: 5\' 7"  (1.702 m)   SpO2: 98 % O2 Device: None (Room air)  Physical Exam: General: Well-appearing, no acute distress HENT: [place], AT Eyes: EOMI, no scleral icterus Respiratory: Clear to auscultation bilaterally. Diminished right base air entry. No crackles, wheezing or rales Cardiovascular: RRR, -M/R/G, no JVD Extremities:-Edema,-tenderness Neuro: AAO x4, CNII-XII grossly intact Skin: Intact, no rashes or bruising Psych: Normal mood, normal affect   Data Reviewed:  Imaging: CT CAP  07/28/19 - No enlarged mediastinal or hilar lymphadenopathy. Small loculated right pleural effusion. Mild emphysema CXR 07/12/20 - Recurrent small right pleural effusion CXR 07/15/20 - Post-thoracentesis. Similar right pleural effusion. No discernable pneumothorax CXR 01/28/21 - Unchanged small right pleural effusion CXR 02/01/21 - Unchanged small right pleural effusion  Right thorax ultrasound 06/29/21  Small right pleural effusion with narrow window amenable to thoracentesis  Assessment & Plan:   Discussion: 87 year old male with history of recurrent chylothorax who presents for shortness of breath. Similar to his prior episodes with recurrent right effusion. We discussed risks and benefits of thoracentesis. His niece asked questions regarding etiology which is unclear. No related trauma or radiation in patient's history. We discussed how prior thoracentesis results showed negative cytology. Patient confirms that he does wish to be aggressive with his care regarding his effusion. Last thoracentesis was 6 months ago.   Right chylothorax, persistent, unclear etiology --Plan for right thoracentesis on 07/05/21 at 3pm --STOP Eliquis until procedure date  Health Maintenance Immunization History  Administered Date(s) Administered   Influenza Whole 09/17/2013   Influenza, High Dose Seasonal PF 07/26/2016, 09/19/2017, 09/06/2018, 09/02/2019, 09/02/2019   Influenza, Seasonal, Injecte, Preservative Fre 09/17/2014   Influenza-Unspecified 09/30/2015   PFIZER(Purple Top)SARS-COV-2 Vaccination 01/29/2020, 02/19/2020, 10/03/2020   Pneumococcal Conjugate-13 07/01/2014, 09/30/2015   Pneumococcal-Unspecified 09/02/2012   Tdap 12/02/2010   Zoster, Live 11/05/2007    No orders of the defined types were placed in this encounter. No orders of the defined types were placed in this encounter.   No follow-ups on file.  I have spent a total time of 35-minutes on the day of the appointment reviewing prior  documentation, coordinating care and discussing medical diagnosis and plan with the patient/family. Imaging, labs and tests included in this note have been reviewed and interpreted independently by me.   Merritt Park, MD [facility] 06/29/2021 12:44 PM  Office Number [phone]

## 2021-07-05 ENCOUNTER — Ambulatory Visit: Payer: Medicare Other | Admitting: Pulmonary Disease

## 2021-07-05 ENCOUNTER — Encounter: Payer: Self-pay | Admitting: Pulmonary Disease

## 2021-07-05 ENCOUNTER — Other Ambulatory Visit (HOSPITAL_COMMUNITY)
Admission: RE | Admit: 2021-07-05 | Discharge: 2021-07-05 | Disposition: A | Discharge status: Home / Self Care | Payer: Medicare Other | Source: Ambulatory Visit | Attending: Pulmonary Disease | Admitting: Pulmonary Disease

## 2021-07-05 ENCOUNTER — Ambulatory Visit (INDEPENDENT_AMBULATORY_CARE_PROVIDER_SITE_OTHER): Payer: Medicare Other

## 2021-07-05 ENCOUNTER — Other Ambulatory Visit (HOSPITAL_COMMUNITY)
Admission: RE | Admit: 2021-07-05 | Discharge: 2021-07-05 | Disposition: A | Discharge status: Home / Self Care | Payer: Medicare Other | Attending: Pulmonary Disease | Admitting: Pulmonary Disease

## 2021-07-05 ENCOUNTER — Other Ambulatory Visit: Payer: Self-pay

## 2021-07-05 VITALS — BP 138/58 | HR 77 | Ht 67.0 in | Wt 165.6 lb

## 2021-07-05 DIAGNOSIS — J940 Chylous effusion: Secondary | ICD-10-CM | POA: Insufficient documentation

## 2021-07-05 DIAGNOSIS — J90 Pleural effusion, not elsewhere classified: Secondary | ICD-10-CM

## 2021-07-05 DIAGNOSIS — Z9889 Other specified postprocedural states: Secondary | ICD-10-CM

## 2021-07-05 DIAGNOSIS — D72820 Lymphocytosis (symptomatic): Secondary | ICD-10-CM | POA: Insufficient documentation

## 2021-07-05 LAB — GLUCOSE, PLEURAL OR PERITONEAL FLUID: Glucose, Fluid: 123 mg/dL

## 2021-07-05 LAB — BODY FLUID CELL COUNT WITH DIFFERENTIAL
Eos, Fluid: 0 %
Lymphs, Fluid: 100 %
Monocyte-Macrophage-Serous Fluid: 0 % — ABNORMAL LOW (ref 50–90)
Neutrophil Count, Fluid: 0 % (ref 0–25)
Total Nucleated Cell Count, Fluid: 170 cu mm (ref 0–1000)

## 2021-07-05 LAB — LACTATE DEHYDROGENASE, PLEURAL OR PERITONEAL FLUID: LD, Fluid: 112 U/L — ABNORMAL HIGH (ref 3–23)

## 2021-07-05 NOTE — Progress Notes (Signed)
.    Subjective:   PATIENT ID: Nicholas Cruz GENDER: male DOB: 1931-01-23, MRN: [id number]   HPI  Chief Complaint  Patient presents with   Shortness of Breath    Thoracentesis     Reason for Visit: Follow-up   [name] is 87 year old male with atrial fibrillation on Eliquis who presents for follow-up for right chylothorax.   Summary: Initially seen in 12/2018 for right pleural effusion. Thoracentesis 12/30/18 with 1.3L milky fluid consistent with chylothorax Repeat thoracentesis on 02/17/19, 07/28/19 and 11/18/19, 06/2020, 01/2021 with symptomatic relief.  All cytology and cultures negative. 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   Last week he presented for three week history of gradually worsening shortness of breath similar to his prior episodes of recurrent chylothorax. Denies wheezing, cough and hemoptysis. He last took Eliquis five days ago  Social History: Former smoker. Quit in 1980. 24 pack-years  I have personally reviewed patient's past medical/family/social history/allergies/current medications.  Past Medical History:  Diagnosis Date   Atrial fibrillation ([place]) 2008   Colorectal polyps 1985   Duodenal ulcer 1968   H/O knee surgery 1990   History of pulmonary embolism 2007   Hyperlipidemia    Hypertension     Allergies  Allergen Reactions   Pradaxa [Dabigatran Etexilate Mesylate] Other (See Comments)    Hemoptysis     Outpatient Medications Prior to Visit  Medication Sig Dispense Refill   alendronate (FOSAMAX) 70 MG tablet Take 1 tablet by mouth once a week.     digoxin (LANOXIN) 0.125 MG tablet TAKE 1 TABLET BY MOUTH  DAILY 90 tablet 2   diltiazem (CARDIZEM CD) 360 MG 24 hr capsule TAKE 1 CAPSULE BY MOUTH  DAILY 90 capsule 1   ELIQUIS 5 MG TABS tablet TAKE 1 TABLET BY MOUTH  TWICE DAILY 180 tablet 1    irbesartan-hydrochlorothiazide (AVALIDE) 300-12.5 MG tablet TAKE 1 TABLET BY MOUTH  DAILY 90 tablet 2   mometasone (NASONEX) 50 MCG/ACT nasal spray Place 2 sprays into the nose daily as needed.      Omega-3 Fatty Acids (FISH OIL) 1000 MG CAPS Take 1,000 mg by mouth 2 (two) times daily.     pantoprazole (PROTONIX) 40 MG tablet Take 1 tablet by mouth every day 60 tablet 0   potassium chloride SA (K-DUR,KLOR-CON) 20 MEQ tablet Take 1 tablet by mouth every day 60 tablet 0   pravastatin (PRAVACHOL) 80 MG tablet Take 1 tablet by mouth every day 60 tablet 0   tamsulosin (FLOMAX) 0.4 MG CAPS capsule TAKE 1 CAPSULE BY MOUTH  DAILY. 90 capsule 1   No facility-administered medications prior to visit.    Review of Systems  Constitutional:  Negative for chills, diaphoresis, fever, malaise/fatigue and weight loss.  HENT:  Negative for congestion.   Respiratory:  Positive for shortness of breath. Negative for cough, hemoptysis, sputum production and wheezing.   Cardiovascular:  Negative for chest pain, palpitations and leg swelling.    Objective:   Vitals:   07/05/21 1501  BP: (!) 138/58  Pulse: 77  SpO2: 98%  Weight: 165 lb 9.6 oz (75.1 kg)  Height: 5\' 7"  (1.702 m)   SpO2: 98 %  Physical Exam: General: Well-appearing, no acute distress HENT: [place], AT Eyes: EOMI, no scleral icterus Respiratory: Clear to auscultation bilaterally.  No crackles, wheezing or rales Cardiovascular: RRR, -M/R/G, no  JVD Extremities:-Edema,-tenderness Neuro: AAO x4, CNII-XII grossly intact Psych: Normal mood, normal affect  Data Reviewed:  Imaging: CT CAP 07/28/19 - No enlarged mediastinal or hilar lymphadenopathy. Small loculated right pleural effusion. Mild emphysema CXR 07/12/20 - Recurrent small right pleural effusion CXR 07/15/20 - Post-thoracentesis. Similar right pleural effusion. No discernable pneumothorax CXR 01/28/21 - Unchanged small right pleural effusion CXR 02/01/21 - Unchanged small right pleural  effusion    Procedure:   Thoracentesis  Procedure Note  Date:07/05/21 Time:3:15 PM  Provider Performing:Jann Milkovich Rodman Pickle   Procedure: Thoracentesis with imaging guidance (43154)  Indication(s) Pleural Effusion  Consent Risks of the procedure as well as the alternatives and risks of each were explained to the patient and/or caregiver.  Consent for the procedure was obtained and is signed in the bedside chart  Anesthesia Topical only with 1% lidocaine    Time Out Verified patient identification, verified procedure, site/side was marked, verified correct patient position, special equipment/implants available, medications/allergies/relevant history reviewed, required imaging and test results available.   Sterile Technique Maximal sterile technique including full sterile barrier drape, hand hygiene, sterile gown, sterile gloves, mask, hair covering, sterile ultrasound probe cover (if used).  Procedure Description Ultrasound was used to identify appropriate pleural anatomy for placement and overlying skin marked.  Area of drainage cleaned and draped in sterile fashion. Lidocaine was used to anesthetize the skin and subcutaneous tissue.  150 cc's of milky appearing fluid was drained from the right pleural space. Catheter then removed and bandaid applied to site.      Complications/Tolerance None; patient tolerated the procedure well. Chest X-ray is ordered to confirm no post-procedural complication.   EBL Minimal   Specimen(s) Pleural fluid    Assessment & Plan:   Discussion: 87 year old male with hx of recurrent chylothorax of unknown etiology who presents for shortness of breath. Symptoms similar to prior episodes of recurrent right effusion. No related trauma or radiation in patient's history. We discussed how prior thoracentesis results showed negative cytology. Patient confirms that he does wish to be aggressive with his care regarding his effusion. Last  thoracentesis was 6 months ago.   Right chylothorax, persistent, unclear etiology --S/p thoracentesis with 150 cc chylous fluid removed. Follow-up cell count, triglyceride, culture and cytology.  Health Maintenance Immunization History  Administered Date(s) Administered   Influenza Whole 09/17/2013   Influenza, High Dose Seasonal PF 07/26/2016, 09/19/2017, 09/06/2018, 09/02/2019, 09/02/2019   Influenza, Seasonal, Injecte, Preservative Fre 09/17/2014   Influenza-Unspecified 09/30/2015   PFIZER(Purple Top)SARS-COV-2 Vaccination 01/29/2020, 02/19/2020, 10/03/2020   Pneumococcal Conjugate-13 07/01/2014, 09/30/2015   Pneumococcal-Unspecified 09/02/2012   Tdap 12/02/2010   Zoster, Live 11/05/2007    Orders Placed This Encounter  Procedures   Body fluid culture    Standing Status:   Future    Number of Occurrences:   1    Standing Expiration Date:   07/05/2022   Body fluid culture w Gram Stain    Ordered by PROVIDER DEFAULT    DG Chest 2 View    Standing Status:   Future    Number of Occurrences:   1    Standing Expiration Date:   07/05/2022    Order Specific Question:   Reason for Exam (SYMPTOM  OR DIAGNOSIS REQUIRED)    Answer:   Pleural effusion    Order Specific Question:   Preferred imaging location?    Answer:   Internal   Body fluid cell count with differential    Standing Status:   Future  Number of Occurrences:   1    Standing Expiration Date:   07/05/2022   Triglycerides, Body Fluid    Standing Status:   Future    Number of Occurrences:   1    Standing Expiration Date:   07/05/2022   Pathologist smear review   No orders of the defined types were placed in this encounter.   Return in about 6 months (around 01/05/2022).  I have spent a total time of 32-minutes on the day of the appointment reviewing prior documentation, coordinating care and discussing medical diagnosis and plan with the patient/family. Past medical history, allergies, medications were reviewed.  Pertinent imaging, labs and tests included in this note have been reviewed and interpreted independently by me.   Tiffnay Bossi Rodman Pickle, MD [facility]  Office Number [phone]

## 2021-07-05 NOTE — Addendum Note (Signed)
Addended by: Suzzanne Cloud E on: 07/05/2021 04:37 PM   Modules accepted: Orders

## 2021-07-05 NOTE — Patient Instructions (Signed)
RESTART your Eliquis TOMORROW MORNING (7/20) Follow-up with me in 6 months

## 2021-07-06 DIAGNOSIS — Z9889 Other specified postprocedural states: Secondary | ICD-10-CM | POA: Diagnosis not present

## 2021-07-06 DIAGNOSIS — J90 Pleural effusion, not elsewhere classified: Secondary | ICD-10-CM | POA: Diagnosis not present

## 2021-07-06 LAB — TRIGLYCERIDES, BODY FLUIDS: Triglycerides, Fluid: 3196 mg/dL

## 2021-07-07 LAB — PATHOLOGIST SMEAR REVIEW

## 2021-07-07 LAB — CHOLESTEROL, BODY FLUID: Cholesterol, Fluid: 91 mg/dL

## 2021-07-07 LAB — CYTOLOGY - NON PAP

## 2021-07-09 LAB — BODY FLUID CULTURE W GRAM STAIN: Culture: NO GROWTH

## 2021-07-12 ENCOUNTER — Telehealth: Payer: Self-pay | Admitting: Pulmonary Disease

## 2021-07-12 NOTE — Telephone Encounter (Signed)
Called and spoke with patient's daughter. She states that the patient has a runny nose all day long all year long. He has tried Human resources officer, Doctor, hospital. She states she is worried that he is taking too much of the allergy medications and they are not working. She would like to know if there is something that you can call in for him. ' Dr. Loanne Drilling please advise  Thank you

## 2021-07-14 MED ORDER — MONTELUKAST SODIUM 10 MG PO TABS: 10.0000 mg | ORAL_TABLET | Freq: Every day | ORAL | 3 refills | Status: DC

## 2021-07-14 NOTE — Telephone Encounter (Signed)
Called and discussed chronic allergies management.  Allergic Rhinitis --START flonase 1 spray per nare twice a day for three days followed by once daily --START singulair 10 mg one tab daily

## 2021-07-15 ENCOUNTER — Other Ambulatory Visit: Payer: Self-pay | Admitting: Interventional Cardiology

## 2021-07-15 NOTE — Telephone Encounter (Signed)
Prescription refill request for Eliquis received. Indication: Afib Last office visit: 08/17/20, Nicholas Cruz  Scr: 1.230 (06/24/21, [place])  Age: 87 Weight: 75.1kg   Appropriate dose and refill sent to requested pharmacy.

## 2021-07-21 NOTE — Telephone Encounter (Signed)
Called and spoke to pt's daughter, Olin Hauser. Informed her of the information per Dr. Loanne Drilling. She verbalized understanding and denied any further questions or concerns at this time.

## 2021-08-08 ENCOUNTER — Ambulatory Visit: Payer: Medicare Other | Admitting: Pulmonary Disease

## 2021-08-18 ENCOUNTER — Ambulatory Visit: Payer: Medicare Other | Admitting: Interventional Cardiology

## 2021-08-19 ENCOUNTER — Other Ambulatory Visit: Payer: Self-pay | Admitting: Pulmonary Disease

## 2021-09-01 ENCOUNTER — Ambulatory Visit: Payer: Medicare Other | Admitting: Interventional Cardiology

## 2021-09-01 ENCOUNTER — Other Ambulatory Visit: Payer: Self-pay

## 2021-09-01 ENCOUNTER — Encounter: Payer: Self-pay | Admitting: Interventional Cardiology

## 2021-09-01 VITALS — BP 122/60 | HR 68 | Ht 67.0 in | Wt 162.6 lb

## 2021-09-01 DIAGNOSIS — I10 Essential (primary) hypertension: Secondary | ICD-10-CM | POA: Diagnosis not present

## 2021-09-01 DIAGNOSIS — I4811 Longstanding persistent atrial fibrillation: Secondary | ICD-10-CM | POA: Diagnosis not present

## 2021-09-01 DIAGNOSIS — E782 Mixed hyperlipidemia: Secondary | ICD-10-CM

## 2021-09-01 DIAGNOSIS — Z7901 Long term (current) use of anticoagulants: Secondary | ICD-10-CM | POA: Diagnosis not present

## 2021-09-01 NOTE — Patient Instructions (Signed)
Medication Instructions:  Your physician recommends that you continue on your current medications as directed. Please refer to the Current Medication list given to you today.  *If you need a refill on your cardiac medications before your next appointment, please call your pharmacy*   Lab Work: Have digoxin level checked when you see Dr Inda Merlin January.  You have prescription for this If you have labs (blood work) drawn today and your tests are completely normal, you will receive your results only by: MyChart Message (if you have MyChart) OR A paper copy in the mail If you have any lab test that is abnormal or we need to change your treatment, we will call you to review the results.   Testing/Procedures: none   Follow-Up: At [facility], you and your health needs are our priority.  As part of our continuing mission to provide you with exceptional heart care, we have created designated Provider Care Teams.  These Care Teams include your primary Cardiologist (physician) and Advanced Practice Providers (APPs -  Physician Assistants and Nurse Practitioners) who all work together to provide you with the care you need, when you need it.  We recommend signing up for the patient portal called "MyChart".  Sign up information is provided on this After Visit Summary.  MyChart is used to connect with patients for Virtual Visits (Telemedicine).  Patients are able to view lab/test results, encounter notes, upcoming appointments, etc.  Non-urgent messages can be sent to your provider as well.   To learn more about what you can do with MyChart, go to [email or web address].    Your next appointment:   12 month(s)  The format for your next appointment:   In Person  Provider:   You may see Larae Grooms, MD or one of the following Advanced Practice Providers on your designated Care Team:   Melina Copa, PA-C [name], PA-C   Other Instructions

## 2021-09-01 NOTE — Progress Notes (Signed)
Cardiology Office Note   Date:  09/01/2021   ID:  [name], DOB 20-Aug-1931, MRN [id number]  PCP:  Josetta Huddle, MD    No chief complaint on file.  AFib  Wt Readings from Last 3 Encounters:  09/01/21 162 lb 9.6 oz (73.8 kg)  07/05/21 165 lb 9.6 oz (75.1 kg)  06/29/21 163 lb 12.8 oz (74.3 kg)       History of Present Illness: Nicholas Cruz is a 87 y.o. male  who has had atrial fibrillation. He was on an antiarrhythmic in the past.  Now Rate controlled.   Eliquis for stroke prevention.     His wife has Alzheimer's disease- although she does not admit it. He is a primary caretaker for her.  THis is a source of stress.  She has gotten worse since the last visit.   He had an echo in Jan 2020 at [facility] office showing dilated atria and was referred back for evaluation.  LV function was normal.    He had some fluid removed from his right lung from VIR.  He felt much better after this procedure a few years ago.    Mild DOE- chronic.  Correlates to when he needs thoracentesis.  Had a tap after 1 year.    Last tap in July 2022.  Breating improved.  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   Denies : Chest pain. Dizziness. Leg edema. Nitroglycerin use. Orthopnea. Palpitations. Paroxysmal nocturnal dyspnea. Syncope.      Past Medical History:  Diagnosis Date   Atrial fibrillation ([place]) 2008   Colorectal polyps 1985   Duodenal ulcer 1968   H/O knee surgery 1990   History of pulmonary embolism 2007   Hyperlipidemia    Hypertension     Past Surgical History:  Procedure Laterality Date   IR THORACENTESIS ASP PLEURAL SPACE W/IMG GUIDE  12/30/2018   IR THORACENTESIS ASP PLEURAL SPACE W/IMG GUIDE  02/17/2019   IR THORACENTESIS ASP PLEURAL SPACE W/IMG GUIDE  07/28/2019   KNEE SURGERY  1990   HS     Current Outpatient Medications  Medication Sig Dispense Refill   alendronate (FOSAMAX) 70 MG tablet Take 1 tablet by mouth once a week.     digoxin  (LANOXIN) 0.125 MG tablet TAKE 1 TABLET BY MOUTH  DAILY 90 tablet 2   diltiazem (CARDIZEM CD) 360 MG 24 hr capsule TAKE 1 CAPSULE BY MOUTH  DAILY 90 capsule 1   ELIQUIS 5 MG TABS tablet TAKE 1 TABLET BY MOUTH  TWICE DAILY 180 tablet 3   fluticasone (FLONASE) 50 MCG/ACT nasal spray Place into both nostrils daily.     irbesartan-hydrochlorothiazide (AVALIDE) 300-12.5 MG tablet TAKE 1 TABLET BY MOUTH  DAILY 90 tablet 2   montelukast (SINGULAIR) 10 MG tablet Take 1 tablet (10 mg total) by mouth at bedtime. 30 tablet 3   Omega-3 Fatty Acids (FISH OIL) 1000 MG CAPS Take 1,000 mg by mouth 2 (two) times daily.     pantoprazole (PROTONIX) 40 MG tablet Take 1 tablet by mouth every day 60 tablet 0   potassium chloride SA (K-DUR,KLOR-CON) 20 MEQ tablet Take 1 tablet by mouth every day 60 tablet 0   pravastatin (PRAVACHOL) 80 MG tablet Take 1 tablet by mouth every day 60 tablet 0   tamsulosin (FLOMAX) 0.4 MG CAPS capsule TAKE 1 CAPSULE BY MOUTH  DAILY. 90 capsule 1   mometasone (NASONEX) 50 MCG/ACT nasal spray Place 2 sprays into the nose  daily as needed.  (Patient not taking: Reported on 09/01/2021)     No current facility-administered medications for this visit.    Allergies:   Pradaxa [dabigatran etexilate mesylate]    Social History:  The patient  reports that he quit smoking about 42 years ago. His smoking use included cigarettes. He started smoking about 73 years ago. He has a 62.00 pack-year smoking history. He has never used smokeless tobacco. He reports that he does not drink alcohol and does not use drugs.   Family History:  The patient's family history includes Cancer - Lung in his brother; Heart attack in his father.    ROS:  Please see the history of present illness.   Otherwise, review of systems are positive for significant stress with wife's illness.   All other systems are reviewed and negative.    PHYSICAL EXAM: VS:  BP 122/60   Pulse 68   Ht '5\' 7"'$  (1.702 m)   Wt 162 lb 9.6 oz  (73.8 kg)   SpO2 95%   BMI 25.47 kg/m  , BMI Body mass index is 25.47 kg/m. GEN: Well nourished, well developed, in no acute distress HEENT: normal Neck: no JVD, carotid bruits, or masses Cardiac: irregularly irregular; no murmurs, rubs, or gallops,no edema  Respiratory:  clear to auscultation bilaterally, normal work of breathing GI: soft, nontender, nondistended, + BS MS: no deformity or atrophy Skin: warm and dry, no rash Neuro:  Strength and sensation are intact Psych: euthymic mood, full affect   EKG:   The ekg ordered today demonstrates AFib, rate controlled   Recent Labs: No results found for requested labs within last 8760 hours.   Lipid Panel    Component Value Date/Time   CHOL 179 11/18/2019 1536   TRIG 226 (H) 11/18/2019 1536   HDL 37 (L) 03/28/2016 0728   CHOLHDL 4.9 03/28/2016 0728   VLDL 36 (H) 03/28/2016 0728   LDLCALC 107 03/28/2016 0728   LDLDIRECT 86.0 05/13/2015 0739     Other studies Reviewed: Additional studies/ records that were reviewed today with results demonstrating: labs reviewed.   ASSESSMENT AND PLAN:  AFib: Rate controlled.  Cardizem and digoxin for rate control.  Will ask Dr. Inda Merlin to check Dig level at next blood draw in Jan 2023.  Anticoagulated: No bleeding issues.  HTN: The current medical regimen is effective;  continue present plan and medications. Hyperlipidemia: LDL 109.  Healthy diet.  Dilated atria: Noted by prior echocardiogram Social: Wife is quite ill and may pass.  He is under a lot stress.     Current medicines are reviewed at length with the patient today.  The patient concerns regarding his medicines were addressed.  The following changes have been made:  No change  Labs/ tests ordered today include:  No orders of the defined types were placed in this encounter.   Recommend 150 minutes/week of aerobic exercise Low fat, low carb, high fiber diet recommended  Disposition:   FU in 1 year   Signed, Larae Grooms, MD  09/01/2021 3:49 PM    [facility], [place],   [address] Phone: [phone]; Fax: [phone]

## 2021-09-24 ENCOUNTER — Other Ambulatory Visit: Payer: Self-pay | Admitting: Pulmonary Disease

## 2021-09-26 MED ORDER — MONTELUKAST SODIUM 10 MG PO TABS: 10.0000 mg | ORAL_TABLET | Freq: Every day | ORAL | 3 refills | Status: DC

## 2021-09-26 NOTE — Addendum Note (Signed)
Addended by: Vanessa Barbara on: 09/26/2021 12:36 PM   Modules accepted: Orders

## 2021-11-28 ENCOUNTER — Telehealth: Payer: Self-pay | Admitting: Pulmonary Disease

## 2021-11-28 NOTE — Telephone Encounter (Signed)
Call made to patient, confirmed DOB. Reports increased increased SOB for last 3 weeks. Reports minimal cough. He confirms he is using his protonix and montelukast daily. Only uses nasal spray as needed. Denies any fevers, chills, and body aches. Patient has a HX of pleural effusion states he feels just like he did when he had previous pleural effusion. Offered an appt for tomorrow,  patient states he would need to see if he can get a ride. He states the SOB is worse at night.   SG please advise. If you want patient seen he states he can try and arrange a ride between Wednesday and Thursday. Thanks :)

## 2021-11-28 NOTE — Telephone Encounter (Signed)
Appt made for Wednesday at 12. Patient will call back if he is unable to get transportation.   Nothing further needed at this time.

## 2021-11-30 ENCOUNTER — Encounter: Payer: Self-pay | Admitting: Nurse Practitioner

## 2021-11-30 ENCOUNTER — Other Ambulatory Visit: Payer: Self-pay

## 2021-11-30 ENCOUNTER — Ambulatory Visit: Payer: Medicare Other | Admitting: Nurse Practitioner

## 2021-11-30 ENCOUNTER — Telehealth: Payer: Self-pay | Admitting: Nurse Practitioner

## 2021-11-30 VITALS — BP 122/68 | HR 77 | Temp 97.8°F | Ht 67.0 in | Wt 164.0 lb

## 2021-11-30 DIAGNOSIS — J301 Allergic rhinitis due to pollen: Secondary | ICD-10-CM | POA: Diagnosis not present

## 2021-11-30 DIAGNOSIS — J940 Chylous effusion: Secondary | ICD-10-CM | POA: Diagnosis not present

## 2021-11-30 DIAGNOSIS — I482 Chronic atrial fibrillation, unspecified: Secondary | ICD-10-CM

## 2021-11-30 NOTE — Assessment & Plan Note (Signed)
Chronic a fib, on anticoagulation with Eliquis. Rate controlled at visit. Hold Eliquis for procedure.

## 2021-11-30 NOTE — H&P (View-Only) (Signed)
@Patient  ID: Nicholas Cruz, male    DOB: Sep 15, 1931, 87 y.o.   MRN: [id number]  Chief Complaint  Patient presents with   Follow-up    Patient reports he has noticed increased shortness of breath at night time.    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 He is a patient of Dr. Cordelia Pen and was last seen in office 07/05/2021 for thoracentesis. Past medical history significant for atrial fibrillation on chronic anticoagulation with Eliquis, hx of PE, hypertension, GERD, BPH, CKD stage II, HLD.  TEST/EVENTS:  01/02/2019 echocardiogram with bubble study: LVEF 60%.  Mild AV regurgitation.  Mild MV regurgitation.  Severe dilation of LA.  RA mildly dilated.  Mild regurgitation of tricuspid valve. 11/12/2019 CT chest with contrast: Focal area of consolidative change in the medial right lower lobe similar or slightly increased in size compared to prior CT.  4 mm right middle lobe nodule, similar to prior CT.  Mild centrilobular emphysema.  Similar minimal interval increase in the size of the right pleural effusion compared to prior. 07/29/2020 CT chest abdomen pelvis with contrast: Atherosclerosis.  Stable right pleural effusion/chylothorax.  Stable small consolidation at the medial aspect of the right lower lobe, likely associated compressive/rounded atelectasis.  No new lung findings.  Bilateral renal cyst.  Extensive diverticulosis of the descending and sigmoid colon but no focal inflammatory changes to suggest acute diverticulitis.  Prostate gland enlarged causing slight mass-effect on the bladder base. 07/05/2021 CXR 2 view: s/p thora.  Small right pleural effusion.  No visualized pneumothorax.  Atherosclerosis.  06/30/2019 OV with Dr. Loanne Drilling.  Worsening shortness of breath over 3 weeks.  Right thorax ultrasound showed small right pleural effusion with  narrow window amenable to thoracentesis.  Schedule thoracentesis for 07/05/2021  07/05/2021: Thoracentesis in office with Dr. Loanne Drilling.  150 cc of milky appearing fluid.  Cytologies and cultures negative.  Elevated LDH.  11/30/2021: Today - acute sick visit Patient reports today for increased shortness of breath which began around 3 weeks ago. It has progressively worsened and lasts throughout the entirety of the day. He reports a minimal nonproductive cough, clear rhinorrhea, and occasional allergy type symptoms. He denies wheezing, orthopnea, PND, chest pain, or lower extremity swelling. He is not currently taking his Singulair and is unsure if he uses the flonase at home or not. He has difficulties with his eyesight and recently lost his wife, so he has been having a hard time keeping up with new medications. He is compliant with his Eliquis therapy and denies any hemoptysis or excessive bleeding/bruising. Overall, he feels ok but is worried that his pleural effusion has reoccurred.   Allergies  Allergen Reactions   Pradaxa [Dabigatran Etexilate Mesylate] Other (See Comments)    Hemoptysis   Amiodarone Other (See Comments)    Other reaction(s): intention tremor    Immunization History  Administered Date(s) Administered   Influenza Split 09/08/2009, 09/08/2011, 08/30/2012, 09/06/2018   Influenza Whole 09/17/2013   Influenza, High Dose Seasonal PF 07/26/2016, 09/19/2017, 09/06/2018, 09/02/2019, 09/02/2019   Influenza, Seasonal, Injecte, Preservative Fre 09/17/2014   Influenza-Unspecified 09/30/2015, 08/18/2021   PFIZER(Purple Top)SARS-COV-2 Vaccination 01/29/2020, 02/19/2020, 10/03/2020   Pneumococcal Conjugate-13 07/01/2014, 09/30/2015, 11/21/2018   Pneumococcal Polysaccharide-23 08/30/2012, 09/04/2012   Pneumococcal-Unspecified 09/02/2012   Tdap 12/02/2010   Zoster, Live 11/05/2007    Past Medical History:  Diagnosis Date   Atrial fibrillation ([place]) 2008  Colorectal polyps 1985    Duodenal ulcer 1968   H/O knee surgery 1990   History of pulmonary embolism 2007   Hyperlipidemia    Hypertension     Tobacco History: Social History   Tobacco Use  Smoking Status Former   Packs/day: 2.00   Years: 31.00   Pack years: 62.00   Types: Cigarettes   Start date: 89   Quit date: 12/18/1978   Years since quitting: 42.9  Smokeless Tobacco Never   Counseling given: Not Answered   Outpatient Medications Prior to Visit  Medication Sig Dispense Refill   alendronate (FOSAMAX) 70 MG tablet Take 1 tablet by mouth once a week.     digoxin (LANOXIN) 0.125 MG tablet TAKE 1 TABLET BY MOUTH  DAILY 90 tablet 2   diltiazem (CARDIZEM CD) 360 MG 24 hr capsule TAKE 1 CAPSULE BY MOUTH  DAILY 90 capsule 1   ELIQUIS 5 MG TABS tablet TAKE 1 TABLET BY MOUTH  TWICE DAILY 180 tablet 3   fluticasone (FLONASE) 50 MCG/ACT nasal spray Place into both nostrils daily.     irbesartan-hydrochlorothiazide (AVALIDE) 300-12.5 MG tablet TAKE 1 TABLET BY MOUTH  DAILY 90 tablet 2   mometasone (NASONEX) 50 MCG/ACT nasal spray Place 2 sprays into the nose daily as needed.     montelukast (SINGULAIR) 10 MG tablet Take 1 tablet (10 mg total) by mouth at bedtime. 30 tablet 3   Omega-3 Fatty Acids (FISH OIL) 1000 MG CAPS Take 1,000 mg by mouth 2 (two) times daily.     pantoprazole (PROTONIX) 40 MG tablet Take 1 tablet by mouth every day 60 tablet 0   potassium chloride SA (K-DUR,KLOR-CON) 20 MEQ tablet Take 1 tablet by mouth every day 60 tablet 0   pravastatin (PRAVACHOL) 80 MG tablet Take 1 tablet by mouth every day 60 tablet 0   tamsulosin (FLOMAX) 0.4 MG CAPS capsule TAKE 1 CAPSULE BY MOUTH  DAILY. 90 capsule 1   montelukast (SINGULAIR) 10 MG tablet Take 1 tablet (10 mg total) by mouth at bedtime. (Patient not taking: Reported on 11/30/2021) 30 tablet 3   No facility-administered medications prior to visit.     Review of Systems:   Constitutional: No weight loss or gain, night sweats, fevers, chills.  +occasional fatigue HEENT: No headaches, difficulty swallowing, tooth/dental problems, or sore throat. No sneezing, itching, ear ache. +nasal congestion, clear rhinorrhea CV:  No chest pain, orthopnea, PND, swelling in lower extremities, anasarca, dizziness, palpitations, syncope Resp: +shortness of breath with exertion and at rest; occasional non productive cough. No hemoptysis. No wheezing.  No chest wall deformity GI:  No heartburn, indigestion, abdominal pain, nausea, vomiting, diarrhea, change in bowel habits, loss of appetite, bloody stools.  GU: No dysuria, change in color of urine, urgency or frequency.  No flank pain, no hematuria  Skin: No rash, lesions, ulcerations MSK:  No joint pain or swelling.  No decreased range of motion.  No back pain. Neuro: No dizziness or lightheadedness.  Psych: No depression or anxiety. Mood stable.     Physical Exam:  BP 122/68 (BP Location: Right Arm, Patient Position: Sitting, Cuff Size: Normal)    Pulse 77    Temp 97.8 F (36.6 C) (Oral)    Ht 5\' 7"  (1.702 m)    Wt 164 lb (74.4 kg)    SpO2 98%    BMI 25.69 kg/m   GEN: Pleasant, interactive, well-nourished; in no acute distress. HEENT:  Normocephalic and atraumatic. EACs patent bilaterally. TM pearly  gray with present light reflex bilaterally. PERRLA. Sclera white. Nasal turbinates pink, moist and patent bilaterally. Clear rhinorrhea present. Oropharynx erythematous and moist, without exudate or edema. No lesions, ulcerations NECK:  Supple w/ fair ROM. No JVD present. Normal carotid impulses w/o bruits. Thyroid symmetrical with no goiter or nodules palpated. No lymphadenopathy.   CV: Irregular rhythm, rate controlled (in a fib chronically); no m/r/g, no peripheral edema. Pulses intact, +2 bilaterally. No cyanosis, pallor or clubbing. PULMONARY:  Unlabored, regular breathing. Diminished breath sounds right P with scattered wheeze; clear otherwise. No accessory muscle use. Dullness to percussion  RLL. GI: BS present and normoactive. Soft, non-tender to palpation. No organomegaly or masses detected. No CVA tenderness. MSK: No erythema, warmth or tenderness. Cap refil <2 sec all extrem. No deformities or joint swelling noted.  Neuro: A/Ox3. No focal deficits noted.   Skin: Warm, no lesions or rashe Psych: Normal affect and behavior. Judgement and thought content appropriate.     Lab Results:  CBC    Component Value Date/Time   WBC 11.2 (H) 11/18/2019 1541   RBC 4.96 11/18/2019 1541   HGB 14.5 11/18/2019 1541   HCT 43.5 11/18/2019 1541   PLT 204.0 11/18/2019 1541   MCV 87.7 11/18/2019 1541   MCH 29.7 12/30/2018 1135   MCHC 33.4 11/18/2019 1541   RDW 15.8 (H) 11/18/2019 1541   LYMPHSABS 1.8 11/18/2019 1541   MONOABS 0.9 11/18/2019 1541   EOSABS 0.2 11/18/2019 1541   BASOSABS 0.1 11/18/2019 1541    BMET    Component Value Date/Time   NA 137 07/23/2020 0924   K 4.1 07/23/2020 0924   CL 104 07/23/2020 0924   CO2 26 07/23/2020 0924   GLUCOSE 172 (H) 07/23/2020 0924   BUN 23 07/23/2020 0924   CREATININE 1.13 07/23/2020 0924   CREATININE 1.27 (H) 08/24/2016 1455   CALCIUM 8.9 07/23/2020 0924   GFRNONAA 52 (L) 08/24/2016 1455   GFRAA 60 08/24/2016 1455    BNP No results found for: BNP   Imaging:  No results found.    No flowsheet data found.  No results found for: NITRICOXIDE    11/30/2021: Right thorax ultrasound showed moderate right pleural effusion. Amenable to thoracentesis.   Assessment & Plan:   Right chylothorax Reoccurrence. [place] at bedside performed by Dr. Silas Flood significant for pleural fluid (up to 1L possible). Will need thoracentesis for drainage. Scheduled for Friday with Dr. Loanne Drilling. Hold Eliquis until after procedure. ED precautions in interim.  Patient Instructions  -Continue singulair 10 mg At bedtime  -Continue Flonase nasal spray 1-2 sprays each nostril daily  -Continue protonix 40 mg daily   Ultrasound today showed recurrence  of your right chylothorax. You will need a thoracentesis for drainage, which we will schedule later this week. Someone will contact you to discuss scheduling.  Follow up with cardiology, as scheduled.   Follow up with Dr. Loanne Drilling after thoracentesis. If symptoms do not improve or worsen, please contact office for sooner follow up or seek emergency care.     AR (allergic rhinitis) Restart singulair and flonase. See above plan.  Atrial fibrillation (HCC) Chronic a fib, on anticoagulation with Eliquis. Rate controlled at visit. Hold Eliquis for procedure.   Clayton Bibles, NP 11/30/2021  Pt aware and understands NP's role.

## 2021-11-30 NOTE — Telephone Encounter (Signed)
Called and spoke with patient and daughter Olin Hauser. Let them know their [facility] is scheduled for 12/02/2021 with Dr. Loanne Drilling at 2 pm.  Patient was instructed to arrive at hospital at 1:30 pm. Patient instructed not to have anything to eat or drink after midnight. Patient needs to hold their blood thinner Eliquis starting today prior to procedure and that it will be discussed after the procedure when to start it back.    Patient voiced understanding, nothing further needed  Routing to Dr. Loanne Drilling as Juluis Rainier

## 2021-11-30 NOTE — Assessment & Plan Note (Addendum)
Reoccurrence. [place] at bedside performed by Dr. Silas Flood significant for pleural fluid (up to 1L possible). Will need thoracentesis for drainage. Scheduled for Friday with Dr. Loanne Drilling. Hold Eliquis until after procedure. ED precautions in interim.  Patient Instructions  -Continue singulair 10 mg At bedtime  -Continue Flonase nasal spray 1-2 sprays each nostril daily  -Continue protonix 40 mg daily   Ultrasound today showed recurrence of your right chylothorax. You will need a thoracentesis for drainage, which we will schedule later this week. Someone will contact you to discuss scheduling.  Follow up with cardiology, as scheduled.   Follow up with Dr. Loanne Drilling after thoracentesis. If symptoms do not improve or worsen, please contact office for sooner follow up or seek emergency care.

## 2021-11-30 NOTE — Telephone Encounter (Signed)
I called the patient and the daughter per DPR  and let them know to be at the hospital at 2 pm on 12/02/21. They are aware to stop the Eliquis until after his procedure and he voices understanding.

## 2021-11-30 NOTE — Progress Notes (Addendum)
@Patient  ID: Nicholas Cruz, male    DOB: [birth year], 87 y.o.   MRN: [id number]  Chief Complaint  Patient presents with   Follow-up    Patient reports he has noticed increased shortness of breath at night time.    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 He is a patient of Dr. Cordelia Pen and was last seen in office 07/05/2021 for thoracentesis. Past medical history significant for atrial fibrillation on chronic anticoagulation with Eliquis, hx of PE, hypertension, GERD, BPH, CKD stage II, HLD.  TEST/EVENTS:  01/02/2019 echocardiogram with bubble study: LVEF 60%.  Mild AV regurgitation.  Mild MV regurgitation.  Severe dilation of LA.  RA mildly dilated.  Mild regurgitation of tricuspid valve. 11/12/2019 CT chest with contrast: Focal area of consolidative change in the medial right lower lobe similar or slightly increased in size compared to prior CT.  4 mm right middle lobe nodule, similar to prior CT.  Mild centrilobular emphysema.  Similar minimal interval increase in the size of the right pleural effusion compared to prior. 07/29/2020 CT chest abdomen pelvis with contrast: Atherosclerosis.  Stable right pleural effusion/chylothorax.  Stable small consolidation at the medial aspect of the right lower lobe, likely associated compressive/rounded atelectasis.  No new lung findings.  Bilateral renal cyst.  Extensive diverticulosis of the descending and sigmoid colon but no focal inflammatory changes to suggest acute diverticulitis.  Prostate gland enlarged causing slight mass-effect on the bladder base. 07/05/2021 CXR 2 view: s/p thora.  Small right pleural effusion.  No visualized pneumothorax.  Atherosclerosis.  06/30/2019 OV with Dr. Loanne Drilling.  Worsening shortness of breath over 3 weeks.  Right thorax ultrasound showed small right pleural effusion with  narrow window amenable to thoracentesis.  Schedule thoracentesis for 07/05/2021  07/05/2021: Thoracentesis in office with Dr. Loanne Drilling.  150 cc of milky appearing fluid.  Cytologies and cultures negative.  Elevated LDH.  11/30/2021: Today - acute sick visit Patient reports today for increased shortness of breath which began around 3 weeks ago. It has progressively worsened and lasts throughout the entirety of the day. He reports a minimal nonproductive cough, clear rhinorrhea, and occasional allergy type symptoms. He denies wheezing, orthopnea, PND, chest pain, or lower extremity swelling. He is not currently taking his Singulair and is unsure if he uses the flonase at home or not. He has difficulties with his eyesight and recently lost his wife, so he has been having a hard time keeping up with new medications. He is compliant with his Eliquis therapy and denies any hemoptysis or excessive bleeding/bruising. Overall, he feels ok but is worried that his pleural effusion has reoccurred.   Allergies  Allergen Reactions   Pradaxa [Dabigatran Etexilate Mesylate] Other (See Comments)    Hemoptysis   Amiodarone Other (See Comments)    Other reaction(s): intention tremor    Immunization History  Administered Date(s) Administered   Influenza Split 09/08/2009, 09/08/2011, 08/30/2012, 09/06/2018   Influenza Whole 09/17/2013   Influenza, High Dose Seasonal PF 07/26/2016, 09/19/2017, 09/06/2018, 09/02/2019, 09/02/2019   Influenza, Seasonal, Injecte, Preservative Fre 09/17/2014   Influenza-Unspecified 09/30/2015, 08/18/2021   PFIZER(Purple Top)SARS-COV-2 Vaccination 01/29/2020, 02/19/2020, 10/03/2020   Pneumococcal Conjugate-13 07/01/2014, 09/30/2015, 11/21/2018   Pneumococcal Polysaccharide-23 08/30/2012, 09/04/2012   Pneumococcal-Unspecified 09/02/2012   Tdap 12/02/2010   Zoster, Live 11/05/2007    Past Medical History:  Diagnosis Date   Atrial fibrillation ([place]) 2008  Colorectal polyps 1985    Duodenal ulcer 1968   H/O knee surgery 1990   History of pulmonary embolism 2007   Hyperlipidemia    Hypertension     Tobacco History: Social History   Tobacco Use  Smoking Status Former   Packs/day: 2.00   Years: 31.00   Pack years: 62.00   Types: Cigarettes   Start date: 45   Quit date: 12/18/1978   Years since quitting: 42.9  Smokeless Tobacco Never   Counseling given: Not Answered   Outpatient Medications Prior to Visit  Medication Sig Dispense Refill   alendronate (FOSAMAX) 70 MG tablet Take 1 tablet by mouth once a week.     digoxin (LANOXIN) 0.125 MG tablet TAKE 1 TABLET BY MOUTH  DAILY 90 tablet 2   diltiazem (CARDIZEM CD) 360 MG 24 hr capsule TAKE 1 CAPSULE BY MOUTH  DAILY 90 capsule 1   ELIQUIS 5 MG TABS tablet TAKE 1 TABLET BY MOUTH  TWICE DAILY 180 tablet 3   fluticasone (FLONASE) 50 MCG/ACT nasal spray Place into both nostrils daily.     irbesartan-hydrochlorothiazide (AVALIDE) 300-12.5 MG tablet TAKE 1 TABLET BY MOUTH  DAILY 90 tablet 2   mometasone (NASONEX) 50 MCG/ACT nasal spray Place 2 sprays into the nose daily as needed.     montelukast (SINGULAIR) 10 MG tablet Take 1 tablet (10 mg total) by mouth at bedtime. 30 tablet 3   Omega-3 Fatty Acids (FISH OIL) 1000 MG CAPS Take 1,000 mg by mouth 2 (two) times daily.     pantoprazole (PROTONIX) 40 MG tablet Take 1 tablet by mouth every day 60 tablet 0   potassium chloride SA (K-DUR,KLOR-CON) 20 MEQ tablet Take 1 tablet by mouth every day 60 tablet 0   pravastatin (PRAVACHOL) 80 MG tablet Take 1 tablet by mouth every day 60 tablet 0   tamsulosin (FLOMAX) 0.4 MG CAPS capsule TAKE 1 CAPSULE BY MOUTH  DAILY. 90 capsule 1   montelukast (SINGULAIR) 10 MG tablet Take 1 tablet (10 mg total) by mouth at bedtime. (Patient not taking: Reported on 11/30/2021) 30 tablet 3   No facility-administered medications prior to visit.     Review of Systems:   Constitutional: No weight loss or gain, night sweats, fevers, chills.  +occasional fatigue HEENT: No headaches, difficulty swallowing, tooth/dental problems, or sore throat. No sneezing, itching, ear ache. +nasal congestion, clear rhinorrhea CV:  No chest pain, orthopnea, PND, swelling in lower extremities, anasarca, dizziness, palpitations, syncope Resp: +shortness of breath with exertion and at rest; occasional non productive cough. No hemoptysis. No wheezing.  No chest wall deformity GI:  No heartburn, indigestion, abdominal pain, nausea, vomiting, diarrhea, change in bowel habits, loss of appetite, bloody stools.  GU: No dysuria, change in color of urine, urgency or frequency.  No flank pain, no hematuria  Skin: No rash, lesions, ulcerations MSK:  No joint pain or swelling.  No decreased range of motion.  No back pain. Neuro: No dizziness or lightheadedness.  Psych: No depression or anxiety. Mood stable.     Physical Exam:  BP 122/68 (BP Location: Right Arm, Patient Position: Sitting, Cuff Size: Normal)    Pulse 77    Temp 97.8 F (36.6 C) (Oral)    Ht 5\' 7"  (1.702 m)    Wt 164 lb (74.4 kg)    SpO2 98%    BMI 25.69 kg/m   GEN: Pleasant, interactive, well-nourished; in no acute distress. HEENT:  Normocephalic and atraumatic. EACs patent bilaterally. TM pearly  gray with present light reflex bilaterally. PERRLA. Sclera white. Nasal turbinates pink, moist and patent bilaterally. Clear rhinorrhea present. Oropharynx erythematous and moist, without exudate or edema. No lesions, ulcerations NECK:  Supple w/ fair ROM. No JVD present. Normal carotid impulses w/o bruits. Thyroid symmetrical with no goiter or nodules palpated. No lymphadenopathy.   CV: Irregular rhythm, rate controlled (in a fib chronically); no m/r/g, no peripheral edema. Pulses intact, +2 bilaterally. No cyanosis, pallor or clubbing. PULMONARY:  Unlabored, regular breathing. Diminished breath sounds right P with scattered wheeze; clear otherwise. No accessory muscle use. Dullness to percussion  RLL. GI: BS present and normoactive. Soft, non-tender to palpation. No organomegaly or masses detected. No CVA tenderness. MSK: No erythema, warmth or tenderness. Cap refil <2 sec all extrem. No deformities or joint swelling noted.  Neuro: A/Ox3. No focal deficits noted.   Skin: Warm, no lesions or rashe Psych: Normal affect and behavior. Judgement and thought content appropriate.     Lab Results:  CBC    Component Value Date/Time   WBC 11.2 (H) 11/18/2019 1541   RBC 4.96 11/18/2019 1541   HGB 14.5 11/18/2019 1541   HCT 43.5 11/18/2019 1541   PLT 204.0 11/18/2019 1541   MCV 87.7 11/18/2019 1541   MCH 29.7 12/30/2018 1135   MCHC 33.4 11/18/2019 1541   RDW 15.8 (H) 11/18/2019 1541   LYMPHSABS 1.8 11/18/2019 1541   MONOABS 0.9 11/18/2019 1541   EOSABS 0.2 11/18/2019 1541   BASOSABS 0.1 11/18/2019 1541    BMET    Component Value Date/Time   NA 137 07/23/2020 0924   K 4.1 07/23/2020 0924   CL 104 07/23/2020 0924   CO2 26 07/23/2020 0924   GLUCOSE 172 (H) 07/23/2020 0924   BUN 23 07/23/2020 0924   CREATININE 1.13 07/23/2020 0924   CREATININE 1.27 (H) 08/24/2016 1455   CALCIUM 8.9 07/23/2020 0924   GFRNONAA 52 (L) 08/24/2016 1455   GFRAA 60 08/24/2016 1455    BNP No results found for: BNP   Imaging:  No results found.    No flowsheet data found.  No results found for: NITRICOXIDE    11/30/2021: Right thorax ultrasound showed moderate right pleural effusion. Amenable to thoracentesis.   Assessment & Plan:   Right chylothorax Reoccurrence. [place] at bedside performed by Dr. Silas Flood significant for pleural fluid (up to 1L possible). Will need thoracentesis for drainage. Scheduled for Friday with Dr. Loanne Drilling. Hold Eliquis until after procedure. ED precautions in interim.  Patient Instructions  -Continue singulair 10 mg At bedtime  -Continue Flonase nasal spray 1-2 sprays each nostril daily  -Continue protonix 40 mg daily   Ultrasound today showed recurrence  of your right chylothorax. You will need a thoracentesis for drainage, which we will schedule later this week. Someone will contact you to discuss scheduling.  Follow up with cardiology, as scheduled.   Follow up with Dr. Loanne Drilling after thoracentesis. If symptoms do not improve or worsen, please contact office for sooner follow up or seek emergency care.     AR (allergic rhinitis) Restart singulair and flonase. See above plan.  Atrial fibrillation (HCC) Chronic a fib, on anticoagulation with Eliquis. Rate controlled at visit. Hold Eliquis for procedure.   Clayton Bibles, NP 11/30/2021  Pt aware and understands NP's role.

## 2021-11-30 NOTE — Assessment & Plan Note (Signed)
Restart singulair and flonase. See above plan.

## 2021-11-30 NOTE — Patient Instructions (Addendum)
-  Continue singulair 10 mg At bedtime  -Continue Flonase nasal spray 1-2 sprays each nostril daily  -Continue protonix 40 mg daily   Ultrasound today showed recurrence of your right chylothorax. You will need a thoracentesis for drainage, which we will schedule later this week. Someone will contact you to discuss scheduling.  Follow up with cardiology, as scheduled.   Follow up with Dr. Loanne Drilling after thoracentesis. If symptoms do not improve or worsen, please contact office for sooner follow up or seek emergency care.

## 2021-11-30 NOTE — Telephone Encounter (Signed)
Please contact patient and ask him to please hold his Eliquis until after his procedure on Friday. We will notify him of location and time for the procedure. Thanks!

## 2021-12-02 ENCOUNTER — Encounter (HOSPITAL_COMMUNITY)
Admission: RE | Disposition: A | Discharge status: Home / Self Care | Payer: Self-pay | Source: Ambulatory Visit | Attending: Pulmonary Disease

## 2021-12-02 ENCOUNTER — Ambulatory Visit (HOSPITAL_COMMUNITY): Payer: Medicare Other

## 2021-12-02 ENCOUNTER — Encounter (HOSPITAL_COMMUNITY): Payer: Self-pay | Admitting: Pulmonary Disease

## 2021-12-02 ENCOUNTER — Ambulatory Visit (HOSPITAL_COMMUNITY)
Admission: RE | Admit: 2021-12-02 | Discharge: 2021-12-02 | Disposition: A | Discharge status: Home / Self Care | Payer: Medicare Other | Source: Ambulatory Visit | Attending: Pulmonary Disease | Admitting: Pulmonary Disease

## 2021-12-02 DIAGNOSIS — J90 Pleural effusion, not elsewhere classified: Secondary | ICD-10-CM | POA: Insufficient documentation

## 2021-12-02 DIAGNOSIS — J940 Chylous effusion: Secondary | ICD-10-CM

## 2021-12-02 DIAGNOSIS — K219 Gastro-esophageal reflux disease without esophagitis: Secondary | ICD-10-CM | POA: Diagnosis not present

## 2021-12-02 DIAGNOSIS — E785 Hyperlipidemia, unspecified: Secondary | ICD-10-CM | POA: Insufficient documentation

## 2021-12-02 DIAGNOSIS — I129 Hypertensive chronic kidney disease with stage 1 through stage 4 chronic kidney disease, or unspecified chronic kidney disease: Secondary | ICD-10-CM | POA: Insufficient documentation

## 2021-12-02 DIAGNOSIS — Z7901 Long term (current) use of anticoagulants: Secondary | ICD-10-CM | POA: Diagnosis not present

## 2021-12-02 DIAGNOSIS — I482 Chronic atrial fibrillation, unspecified: Secondary | ICD-10-CM | POA: Insufficient documentation

## 2021-12-02 DIAGNOSIS — Z86711 Personal history of pulmonary embolism: Secondary | ICD-10-CM | POA: Insufficient documentation

## 2021-12-02 DIAGNOSIS — N400 Enlarged prostate without lower urinary tract symptoms: Secondary | ICD-10-CM | POA: Diagnosis not present

## 2021-12-02 DIAGNOSIS — N182 Chronic kidney disease, stage 2 (mild): Secondary | ICD-10-CM | POA: Diagnosis not present

## 2021-12-02 DIAGNOSIS — Z9889 Other specified postprocedural states: Secondary | ICD-10-CM

## 2021-12-02 DIAGNOSIS — Z87891 Personal history of nicotine dependence: Secondary | ICD-10-CM | POA: Diagnosis not present

## 2021-12-02 DIAGNOSIS — J9811 Atelectasis: Secondary | ICD-10-CM | POA: Diagnosis not present

## 2021-12-02 DIAGNOSIS — J309 Allergic rhinitis, unspecified: Secondary | ICD-10-CM | POA: Insufficient documentation

## 2021-12-02 DIAGNOSIS — J948 Other specified pleural conditions: Secondary | ICD-10-CM | POA: Diagnosis not present

## 2021-12-02 HISTORY — PX: THORACENTESIS: SHX235

## 2021-12-02 LAB — PROTEIN, PLEURAL OR PERITONEAL FLUID: Total protein, fluid: 3.9 g/dL

## 2021-12-02 LAB — BODY FLUID CELL COUNT WITH DIFFERENTIAL: Total Nucleated Cell Count, Fluid: 6 cu mm (ref 0–1000)

## 2021-12-02 LAB — LACTATE DEHYDROGENASE, PLEURAL OR PERITONEAL FLUID: LD, Fluid: 145 U/L — ABNORMAL HIGH (ref 3–23)

## 2021-12-02 SURGERY — THORACENTESIS
Anesthesia: LOCAL | Laterality: Right

## 2021-12-02 NOTE — CV Procedure (Addendum)
°  Procedure:   Thoracentesis  Procedure Note  Date:12/02/21 Time:2:15 PM  Provider Performing:Kjuan Seipp Rodman Pickle, MD  Procedure: Thoracentesis with imaging guidance [id number])  Indication(s) Pleural Effusion  Consent Risks of the procedure as well as the alternatives and risks of each were explained to the patient and/or caregiver.  Consent for the procedure was obtained and is signed in the bedside chart  Anesthesia Topical only with 1% lidocaine    Time Out Verified patient identification, verified procedure, site/side was marked, verified correct patient position, special equipment/implants available, medications/allergies/relevant history reviewed, required imaging and test results available.   Sterile Technique Maximal sterile technique including full sterile barrier drape, hand hygiene, sterile gown, sterile gloves, mask, hair covering, sterile ultrasound probe cover (if used).  Procedure Description Ultrasound was used to identify appropriate pleural anatomy for placement and overlying skin marked.  Area of drainage cleaned and draped in sterile fashion. Lidocaine was used to anesthetize the skin and subcutaneous tissue.  250 cc's of chylous appearing fluid was drained from the right pleural space. Catheter then removed and bandaid applied to site.    Complications/Tolerance None; patient tolerated the procedure well. Chest X-ray is ordered to confirm no post-procedural complication.  CXR reviewed. No pneumothorax post-procedure. OK to discharge home. Communicated to bedside RN   EBL Minimal   Specimen(s) Pleural fluid for cell count, culture, LDH, protein, cytology and triglyceride  Rodman Pickle, M.D. [facility]monary/Critical [facility] Medicine 12/02/2021 2:55 PM   See Amion for personal pager For hours between 7 PM to 7 AM, please call Elink for urgent questions

## 2021-12-02 NOTE — Discharge Instructions (Signed)
Ok to restart Eliquis tonight  We will schedule you to follow-up in clinic with me in 3 months

## 2021-12-02 NOTE — Interval H&P Note (Signed)
History and Physical Interval Note:  12/02/2021 1:57 PM  Nicholas Cruz  has presented today for surgery, with the diagnosis of pleural effusion.  The various methods of treatment have been discussed with the patient and family. After consideration of risks, benefits and other options for treatment, the patient has consented to  Procedure(s): THORACENTESIS (Right) as a surgical intervention.  The patient's history has been reviewed, patient examined, no change in status, stable for surgery.  I have reviewed the patient's chart and labs.  Questions were answered to the patient's satisfaction.     Zyir Gassert Rodman Pickle

## 2021-12-04 ENCOUNTER — Encounter (HOSPITAL_COMMUNITY): Payer: Self-pay | Admitting: Pulmonary Disease

## 2021-12-04 LAB — TRIGLYCERIDES, BODY FLUIDS: Triglycerides, Fluid: 3221 mg/dL

## 2021-12-05 LAB — BODY FLUID CULTURE W GRAM STAIN
Culture: NO GROWTH
Gram Stain: NONE SEEN

## 2021-12-05 LAB — CYTOLOGY - NON PAP

## 2021-12-07 ENCOUNTER — Telehealth: Payer: Self-pay | Admitting: Interventional Cardiology

## 2021-12-07 MED ORDER — IRBESARTAN-HYDROCHLOROTHIAZIDE 300-12.5 MG PO TABS: 1.0000 | ORAL_TABLET | Freq: Every day | ORAL | 2 refills | Status: DC

## 2021-12-07 NOTE — Telephone Encounter (Signed)
Pt's medication was sent to pt's pharmacy as requested. Confirmation received.  °

## 2021-12-07 NOTE — Telephone Encounter (Signed)
°*  STAT* If patient is at the pharmacy, call can be transferred to refill team.   1. Which medications need to be refilled? (please list name of each medication and dose if known)  irbesartan-hydrochlorothiazide (AVALIDE) 300-12.5 MG tablet  2. Which pharmacy/location (including street and city if local pharmacy) is medication to be sent to? [place], [place] AT [place]  3. Do they need a 30 day or 90 day supply? 90 with refills

## 2021-12-23 ENCOUNTER — Other Ambulatory Visit: Payer: Self-pay | Admitting: Pulmonary Disease

## 2021-12-26 ENCOUNTER — Telehealth: Payer: Self-pay | Admitting: Pulmonary Disease

## 2021-12-26 DIAGNOSIS — J90 Pleural effusion, not elsewhere classified: Secondary | ICD-10-CM

## 2021-12-26 DIAGNOSIS — D6869 Other thrombophilia: Secondary | ICD-10-CM | POA: Diagnosis not present

## 2021-12-26 DIAGNOSIS — E0841 Diabetes mellitus due to underlying condition with diabetic mononeuropathy: Secondary | ICD-10-CM | POA: Diagnosis not present

## 2021-12-26 DIAGNOSIS — H6123 Impacted cerumen, bilateral: Secondary | ICD-10-CM | POA: Diagnosis not present

## 2021-12-26 DIAGNOSIS — I4891 Unspecified atrial fibrillation: Secondary | ICD-10-CM | POA: Diagnosis not present

## 2021-12-26 DIAGNOSIS — I2510 Atherosclerotic heart disease of native coronary artery without angina pectoris: Secondary | ICD-10-CM | POA: Diagnosis not present

## 2021-12-26 NOTE — Telephone Encounter (Signed)
Called and spoke with Nicholas Cruz letting her know the info from Dr. Shearon Stalls and she verbalized understanding. Pt has been scheduled for an appt with Dr. Loanne Drilling. Did go ahead and told Nicholas Cruz to make sure pt holds Eliquis 2 days prior to appt 1/27 in case a thoracentesis needs to be done and she verbalized understanding. Cxr has been ordered. Nothing further needed.

## 2021-12-26 NOTE — Telephone Encounter (Signed)
Recommend next available appointment with Dr. Loanne Drilling for chest xray and thoracentesis evaluation. Will need 30 minute slot

## 2021-12-26 NOTE — Telephone Encounter (Signed)
Spoke with pt who states SOB has increased which leads him to believe the fluid has returned on his right lung. Pt states the SOB comes and goes and that he would be more concerned if he were SOB all the time. Pt denies any other resp. Symptoms as well as fever and GI upset. Dr. Shearon Stalls could you please advise as Dr. Loanne Drilling is unavailable at this time.

## 2021-12-27 NOTE — Telephone Encounter (Signed)
Ok to offer appointment on January 17, 18, or 19 at 10 AM with me. Whichever day he selects, he will need to hold Eliquis two days before appointment to plan for thoracentesis.

## 2021-12-28 NOTE — Telephone Encounter (Signed)
Spoke with daughter Olin Hauser and rescheduled pt for 01/03/22 at 10 am arrival at 9:45 am per Dr. Loanne Drilling. Daughter aware to hold Eliquis starting 01/01/22. Nothing further needed at this time.

## 2022-01-03 ENCOUNTER — Other Ambulatory Visit (HOSPITAL_COMMUNITY)
Admission: RE | Admit: 2022-01-03 | Discharge: 2022-01-03 | Disposition: A | Discharge status: Home / Self Care | Payer: Medicare Other | Source: Ambulatory Visit | Attending: Pulmonary Disease | Admitting: Pulmonary Disease

## 2022-01-03 ENCOUNTER — Ambulatory Visit: Payer: Medicare Other | Admitting: Pulmonary Disease

## 2022-01-03 ENCOUNTER — Ambulatory Visit (INDEPENDENT_AMBULATORY_CARE_PROVIDER_SITE_OTHER): Payer: Medicare Other

## 2022-01-03 ENCOUNTER — Other Ambulatory Visit: Payer: Self-pay

## 2022-01-03 ENCOUNTER — Telehealth: Payer: Self-pay | Admitting: Pulmonary Disease

## 2022-01-03 ENCOUNTER — Encounter: Payer: Self-pay | Admitting: Pulmonary Disease

## 2022-01-03 VITALS — BP 130/64 | HR 85 | Temp 97.6°F | Ht 67.0 in | Wt 164.0 lb

## 2022-01-03 DIAGNOSIS — J940 Chylous effusion: Secondary | ICD-10-CM | POA: Diagnosis not present

## 2022-01-03 DIAGNOSIS — J90 Pleural effusion, not elsewhere classified: Secondary | ICD-10-CM

## 2022-01-03 DIAGNOSIS — R091 Pleurisy: Secondary | ICD-10-CM | POA: Diagnosis not present

## 2022-01-03 NOTE — Telephone Encounter (Signed)
Dr. Loanne Drilling, please advise on this for pt about his Eliquis.

## 2022-01-03 NOTE — Telephone Encounter (Signed)
Please restart eliquis tonight.

## 2022-01-03 NOTE — Telephone Encounter (Signed)
Called and spoke with Nicholas Cruz. She verbalized understanding of the Eliquis. Nothing further needed at time of call.

## 2022-01-03 NOTE — Patient Instructions (Signed)
°  Right chylothorax, persistent, unclear etiology --S/p thoracentesis with 200 cc chylous fluid removed.  --Follow-up cell count, triglyceride, culture and cytology.  Follow-up with me in March 2023

## 2022-01-03 NOTE — Progress Notes (Signed)
.    Subjective:   PATIENT ID: Nicholas Cruz GENDER: male DOB: 1931/11/07, MRN: [id number]   HPI  Chief Complaint  Patient presents with   Follow-up    Thoracentesis     Reason for Visit: Follow-up   Nicholas Cruz is 88 year old male with atrial fibrillation on Eliquis who presents for follow-up for right chylothorax.   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 Unknown etiology despite work-up. Cytology neg multiple times. Routinely requires thoracentesis for symptom relief. 2020 - Thoracentesis in Jan, March, Aug and Dec 2021 - Thoracentesis in July 2022 - Thoracentesis in Feb, July, Dec 2023 - Thoracentesis in Jan  01/03/22 He recently had a thoracentesis in December. And now has developed dyspnea four weeks, which is his shortest interval between procedures for his chylothorax. Denies fevers, chills, cough. He has held his Plavix in anticipation for this. Family present.  Social History: Former smoker. Quit in 1980. 24 pack-years  Past Medical History:  Diagnosis Date   Atrial fibrillation ([place]) 2008   Colorectal polyps 1985   Duodenal ulcer 1968   H/O knee surgery 1990   History of pulmonary embolism 2007   Hyperlipidemia    Hypertension     Allergies  Allergen Reactions   Pradaxa [Dabigatran Etexilate Mesylate] Other (See Comments)    Hemoptysis   Amiodarone Other (See Comments)    Other reaction(s): intention tremor     Outpatient Medications Prior to Visit  Medication Sig Dispense Refill   cholecalciferol (VITAMIN D3) 25 MCG (1000 UNIT) tablet Take 1,000 Units by mouth daily.     digoxin (LANOXIN) 0.125 MG tablet TAKE 1 TABLET BY MOUTH  DAILY 90 tablet 2   diltiazem (CARDIZEM CD) 360 MG 24 hr capsule TAKE 1 CAPSULE BY MOUTH  DAILY 90 capsule 1   ELIQUIS 5 MG TABS tablet TAKE 1 TABLET BY MOUTH  TWICE DAILY 180 tablet 3   fluticasone (FLONASE) 50 MCG/ACT nasal spray Place 1 spray into both nostrils daily.     furosemide (LASIX) 20 MG  tablet Take 20 mg by mouth daily as needed for edema.     irbesartan-hydrochlorothiazide (AVALIDE) 300-12.5 MG tablet Take 1 tablet by mouth daily. 90 tablet 2   Misc Natural Products (PROSTATE HEALTH PO) Take 1 capsule by mouth daily.     montelukast (SINGULAIR) 10 MG tablet TAKE 1 TABLET(10 MG) BY MOUTH Cruz BEDTIME 30 tablet 2   Multiple Vitamins-Minerals (MULTIVITAMIN WITH MINERALS) tablet Take 1 tablet by mouth daily.     Multiple Vitamins-Minerals (PRESERVISION AREDS 2+MULTI VIT PO) Take 1 capsule by mouth in the morning and Cruz bedtime.     Omega-3 Fatty Acids (FISH OIL) 1000 MG CAPS Take 1,000 mg by mouth 2 (two) times daily.     pantoprazole (PROTONIX) 40 MG tablet Take 1 tablet by mouth every day (Patient taking differently: Take 40 mg by mouth Cruz bedtime as needed (acid reflux).) 60 tablet 0   potassium chloride SA (K-DUR,KLOR-CON) 20 MEQ tablet Take 1 tablet by mouth every day 60 tablet 0   pravastatin (PRAVACHOL) 80 MG tablet Take 1 tablet by mouth every day (Patient taking differently: Take 80 mg by mouth Cruz bedtime.) 60 tablet 0   tamsulosin (FLOMAX) 0.4 MG CAPS capsule TAKE 1 CAPSULE BY MOUTH  DAILY. 90 capsule 1   Multiple Vitamins-Minerals (MULTIVITAMIN WITH MINERALS) tablet Take 1 tablet by mouth daily.     No facility-administered medications prior to visit.    Review of  Systems  Constitutional:  Negative for chills, diaphoresis, fever, malaise/fatigue and weight loss.  HENT:  Negative for congestion.   Respiratory:  Positive for shortness of breath. Negative for cough, hemoptysis, sputum production and wheezing.   Cardiovascular:  Negative for chest pain, palpitations and leg swelling.    Objective:   Vitals:   01/03/22 1015  BP: 130/64  Pulse: 85  Temp: 97.6 F (36.4 C)  TempSrc: Oral  SpO2: 97%  Weight: 164 lb (74.4 kg)  Height: 5\' 7"  (1.702 m)   SpO2: 97 % O2 Device: None (Room air)  Physical Exam: General: Well-appearing, no acute distress HENT: Nicholas Cruz Eyes: EOMI, no scleral icterus Respiratory: Clear to auscultation bilaterally.  No crackles, wheezing or rales Cardiovascular: RRR, -M/R/G, no JVD Extremities:-Edema,-tenderness Neuro: AAO x4, CNII-XII grossly intact Psych: Normal mood, normal affect  Data Reviewed:  Imaging: CT CAP 07/28/19 - No enlarged mediastinal or hilar lymphadenopathy. Small loculated right pleural effusion. Mild emphysema CXR 07/12/20 - Recurrent small right pleural effusion CXR 07/15/20 - Post-thoracentesis. Similar right pleural effusion. No discernable pneumothorax CXR 01/28/21 - Unchanged small right pleural effusion CXR 02/01/21 - Unchanged small right pleural effusion CXR 12/02/21 - Small right pleural effusion. No PTX CXR 01/03/22 - Mildly increased right pleural effusion compared to prior CXR 01/03/22 - Post thoracentesis with no discernible PTX, decreased right pleural effusion   Procedure:   Procedure:   Thoracentesis  Procedure Note  Date:01/03/22 Time:10:30 AM  Provider Performing:Sabine Tenenbaum Rodman Pickle, MD  Procedure: Thoracentesis with imaging guidance ([id number])  Indication(s) Pleural Effusion  Consent Risks of the procedure as well as the alternatives and risks of each were explained to the patient and/or caregiver.  Consent for the procedure was obtained and is signed in the bedside chart  Anesthesia Topical only with 1% lidocaine    Time Out Verified patient identification, verified procedure, site/side was marked, verified correct patient position, special equipment/implants available, medications/allergies/relevant history reviewed, required imaging and test results available.   Sterile Technique Maximal sterile technique including full sterile barrier drape, hand hygiene, sterile gown, sterile gloves, mask, hair covering, sterile ultrasound probe cover (if used).  Procedure Description Ultrasound was used to identify appropriate pleural anatomy for placement and overlying skin  marked.  Area of drainage cleaned and draped in sterile fashion. Lidocaine was used to anesthetize the skin and subcutaneous tissue.  200 cc's of chylous appearing fluid was drained from the right pleural space. Catheter then removed and bandaid applied to site.   Complications/Tolerance None; patient tolerated the procedure well. Chest X-ray is ordered to confirm no post-procedural complication.   EBL Minimal   Specimen(s) Pleural fluid   Assessment & Plan:   Discussion: 88 year old male with hx of recurrent chylothorax of unknown etiology who presents for shortness of breath secondary to increased chylothorax. Symptoms similar to his prior episodes. This is the shortest interval of recurrent symptoms (4 weeks vs 5 month intervals). We discussed that if this increases in frequency, we may need to consider PleurX vs pleurodesis. Patient prefers not being aggressive with the management of his effusion if possible.  Right chylothorax, persistent, unclear etiology --S/p thoracentesis with 200 cc chylous fluid removed.  --Follow-up cell count, triglyceride, culture and cytology.  Health Maintenance Immunization History  Administered Date(s) Administered   Influenza Split 09/08/2009, 09/08/2011, 08/30/2012, 09/06/2018   Influenza Whole 09/17/2013   Influenza, High Dose Seasonal PF 07/26/2016, 09/19/2017, 09/06/2018, 09/02/2019, 09/02/2019   Influenza, Seasonal, Injecte, Preservative Fre 09/17/2014   Influenza-Unspecified 09/30/2015,  08/18/2021   PFIZER(Purple Top)SARS-COV-2 Vaccination 01/29/2020, 02/19/2020, 10/03/2020   Pneumococcal Conjugate-13 07/01/2014, 09/30/2015, 11/21/2018   Pneumococcal Polysaccharide-23 08/30/2012, 09/04/2012   Pneumococcal-Unspecified 09/02/2012   Tdap 12/02/2010   Zoster, Live 11/05/2007    Orders Placed This Encounter  Procedures   Body fluid culture    Standing Status:   Future    Number of Occurrences:   1    Standing Expiration Date:    01/03/2023   Gram stain    Standing Status:   Future    Number of Occurrences:   1    Standing Expiration Date:   01/03/2023   DG Chest 2 View    Standing Status:   Future    Number of Occurrences:   1    Standing Expiration Date:   01/03/2023    Order Specific Question:   Reason for Exam (SYMPTOM  OR DIAGNOSIS REQUIRED)    Answer:   thorocentisi    Order Specific Question:   Preferred imaging location?    Answer:   Internal   Glucose, pleural or peritoneal fluid    Standing Status:   Future    Number of Occurrences:   1    Standing Expiration Date:   01/03/2023   Protein, body fluid (other)    Standing Status:   Future    Number of Occurrences:   1    Standing Expiration Date:   01/03/2023   Cholesterol, body fluid    Standing Status:   Future    Number of Occurrences:   1    Standing Expiration Date:   01/03/2023   Lactate dehydrogenase (pleural or peritoneal fluid)    Standing Status:   Future    Number of Occurrences:   1    Standing Expiration Date:   01/03/2023   Body fluid cell count with differential    Standing Status:   Future    Number of Occurrences:   1    Standing Expiration Date:   01/03/2023   Triglycerides, Body Fluid    Standing Status:   Future    Number of Occurrences:   1    Standing Expiration Date:   01/03/2023   No orders of the defined types were placed in this encounter.  Return in about 2 months (around 03/03/2022).  I have spent a total time of 33-minutes on the day of the appointment reviewing prior documentation, coordinating care and discussing medical diagnosis and plan with the patient/family. Past medical history, allergies, medications were reviewed. Pertinent imaging, labs and tests included in this note have been reviewed and interpreted independently by me.  Nicholas Auriemma Rodman Pickle, MD [facility]  Office Number [phone]

## 2022-01-04 LAB — BODY FLUID CELL COUNT WITH DIFFERENTIAL: Total Nucleated Cell Count, Fluid: 246 cu mm (ref 0–1000)

## 2022-01-04 LAB — LACTATE DEHYDROGENASE, PLEURAL OR PERITONEAL FLUID: LD, Fluid: 133 U/L — ABNORMAL HIGH (ref 3–23)

## 2022-01-04 LAB — PROTEIN, BODY FLUID (OTHER): Protein, Fluid: 3.9 g/dL

## 2022-01-05 LAB — CYTOLOGY - NON PAP

## 2022-01-05 LAB — TRIGLYCERIDES, BODY FLUIDS: Triglycerides, Fluid: 2360 mg/dL

## 2022-01-07 LAB — CHOLESTEROL, BODY FLUID: Cholesterol, Fluid: 86 mg/dL

## 2022-01-08 LAB — GRAM STAIN
GRAM STAIN:: NONE SEEN
MICRO NUMBER:: 12880140
SPECIMEN QUALITY:: ADEQUATE

## 2022-01-08 LAB — GLUCOSE, PLEURAL OR PERITONEAL FLUID: Glucose, Pleural fluid: 118 mg/dL

## 2022-01-09 ENCOUNTER — Encounter: Payer: Self-pay | Admitting: Pulmonary Disease

## 2022-01-10 LAB — SPECIMEN STATUS REPORT

## 2022-01-13 ENCOUNTER — Ambulatory Visit: Payer: Medicare Other | Admitting: Pulmonary Disease

## 2022-03-01 ENCOUNTER — Other Ambulatory Visit: Payer: Self-pay

## 2022-03-01 ENCOUNTER — Encounter: Payer: Self-pay | Admitting: Pulmonary Disease

## 2022-03-01 ENCOUNTER — Ambulatory Visit: Payer: Medicare Other | Admitting: Pulmonary Disease

## 2022-03-01 VITALS — BP 134/50 | HR 62 | Temp 97.9°F | Ht 67.0 in | Wt 166.4 lb

## 2022-03-01 DIAGNOSIS — J940 Chylous effusion: Secondary | ICD-10-CM | POA: Diagnosis not present

## 2022-03-01 NOTE — Patient Instructions (Signed)
Right chylothorax, persistent, unclear etiology ?--Follow-up with me in 6 month ? ?

## 2022-03-01 NOTE — Progress Notes (Signed)
. ? ? ? ?Subjective:  ? ?PATIENT ID: Nicholas Cruz GENDER: male DOB: 28-Jun-1931, MRN: [id number] ? ? ?HPI ? ?Chief Complaint  ?Patient presents with  ? Follow-up  ? ? ?Reason for Visit: Follow-up  ? ?[name] is 88 year old male with atrial fibrillation on Eliquis who presents for follow-up for right chylothorax.  ? 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 Unknown etiology despite work-up. Cytology neg multiple times. Routinely requires thoracentesis for symptom relief. ?2020 - Thoracentesis in Jan, March, Aug and Dec ?2021 - Thoracentesis in July ?2022 - Thoracentesis in Feb, July, Dec ?2023 - Thoracentesis in Jan ? ?03/01/22 ?Presents with family who provides additional history as noted belowe ?Denies shortness of breath, chest pain. ?Using bike for 15 min four times a day ?Walking more frequently ?Compliant with low-fat diet ? ?Social History: ?Former smoker. Quit in 1980. 24 pack-years ? ?Past Medical History:  ?Diagnosis Date  ? Atrial fibrillation ([place]) 2008  ? Colorectal polyps 1985  ? Duodenal ulcer 1968  ? H/O knee surgery 1990  ? History of pulmonary embolism 2007  ? Hyperlipidemia   ? Hypertension   ?  ?Allergies  ?Allergen Reactions  ? Pradaxa [Dabigatran Etexilate Mesylate] Other (See Comments)  ?  Hemoptysis  ? Amiodarone Other (See Comments)  ?  Other reaction(s): intention tremor  ?  ? ?Outpatient Medications Prior to Visit  ?Medication Sig Dispense Refill  ? cholecalciferol (VITAMIN D3) 25 MCG (1000 UNIT) tablet Take 1,000 Units by mouth daily.    ? digoxin (LANOXIN) 0.125 MG tablet TAKE 1 TABLET BY MOUTH  DAILY 90 tablet 2  ? diltiazem (CARDIZEM CD) 360 MG 24 hr capsule TAKE 1 CAPSULE BY MOUTH  DAILY 90 capsule 1  ? ELIQUIS 5 MG TABS tablet TAKE 1 TABLET BY MOUTH  TWICE DAILY 180 tablet 3  ? fluticasone (FLONASE) 50 MCG/ACT nasal spray Place 1 spray into both nostrils daily.    ? furosemide (LASIX) 20 MG tablet Take 20 mg by mouth daily as needed for edema.    ?  irbesartan-hydrochlorothiazide (AVALIDE) 300-12.5 MG tablet Take 1 tablet by mouth daily. 90 tablet 2  ? Misc Natural Products (PROSTATE HEALTH PO) Take 1 capsule by mouth daily.    ? montelukast (SINGULAIR) 10 MG tablet TAKE 1 TABLET(10 MG) BY MOUTH AT BEDTIME 30 tablet 2  ? Multiple Vitamins-Minerals (MULTIVITAMIN WITH MINERALS) tablet Take 1 tablet by mouth daily.    ? Multiple Vitamins-Minerals (PRESERVISION AREDS 2+MULTI VIT PO) Take 1 capsule by mouth in the morning and at bedtime.    ? Omega-3 Fatty Acids (FISH OIL) 1000 MG CAPS Take 1,000 mg by mouth 2 (two) times daily.    ? pantoprazole (PROTONIX) 40 MG tablet Take 1 tablet by mouth every day (Patient taking differently: Take 40 mg by mouth at bedtime as needed (acid reflux).) 60 tablet 0  ? potassium chloride SA (K-DUR,KLOR-CON) 20 MEQ tablet Take 1 tablet by mouth every day 60 tablet 0  ? pravastatin (PRAVACHOL) 80 MG tablet Take 1 tablet by mouth every day (Patient taking differently: Take 80 mg by mouth at bedtime.) 60 tablet 0  ? tamsulosin (FLOMAX) 0.4 MG CAPS capsule TAKE 1 CAPSULE BY MOUTH  DAILY. 90 capsule 1  ? ?No facility-administered medications prior to visit.  ? ? ?Review of Systems  ?Constitutional:  Negative for chills, diaphoresis, fever, malaise/fatigue and weight loss.  ?HENT:  Negative for congestion.   ?Respiratory:  Negative for cough, hemoptysis, sputum  production, shortness of breath and wheezing.   ?Cardiovascular:  Negative for chest pain, palpitations and leg swelling.  ? ? ?Objective:  ? ?Vitals:  ? 03/01/22 1333  ?BP: (!) 134/50  ?Pulse: 62  ?Temp: 97.9 ?F (36.6 ?C)  ?TempSrc: Oral  ?SpO2: 97%  ?Weight: 166 lb 6.4 oz (75.5 kg)  ?Height: '5\' 7"'$  (1.702 m)  ? ?  ? ?Physical Exam: ?General: Well-appearing, no acute distress ?HENT: [place], AT ?Eyes: EOMI, no scleral icterus ?Respiratory: Clear to auscultation bilaterally.  No crackles, wheezing or rales ?Cardiovascular: RRR, -M/R/G, no JVD ?Extremities:-Edema,-tenderness ?Neuro: AAO x4,  CNII-XII grossly intact ?Psych: Normal mood, normal affect ? ?Data Reviewed: ? ?Imaging: ?CT CAP 07/28/19 - No enlarged mediastinal or hilar lymphadenopathy. Small loculated right pleural effusion. Mild emphysema ?CXR 07/12/20 - Recurrent small right pleural effusion ?CXR 07/15/20 - Post-thoracentesis. Similar right pleural effusion. No discernable pneumothorax ?CXR 01/28/21 - Unchanged small right pleural effusion ?CXR 02/01/21 - Unchanged small right pleural effusion ?CXR 12/02/21 - Small right pleural effusion. No PTX ?CXR 01/03/22 - Mildly increased right pleural effusion compared to prior ?CXR 01/03/22 - Post thoracentesis with no discernible PTX, decreased right pleural effusion ? ? ?Assessment & Plan:  ? ?Discussion: ?88 year old male with history of recurrent chylothorax of unknown etiologically presents for follow-up. We reviewed patient's history and prior need for pleural fluid drainage. Asymptomatic today. Discussed management including diet, exercise and drainage as needed. ? ?Right chylothorax, persistent, unclear etiology ?--Follow-up with me in 6 month ? ?Health Maintenance ?Immunization History  ?Administered Date(s) Administered  ? Influenza Split 09/08/2009, 09/08/2011, 08/30/2012, 09/06/2018  ? Influenza Whole 09/17/2013  ? Influenza, High Dose Seasonal PF 07/26/2016, 09/19/2017, 09/06/2018, 09/02/2019, 09/02/2019  ? Influenza, Seasonal, Injecte, Preservative Fre 09/17/2014  ? Influenza-Unspecified 09/30/2015, 08/18/2021  ? PFIZER(Purple Top)SARS-COV-2 Vaccination 01/29/2020, 02/19/2020, 10/03/2020  ? PNEUMOCOCCAL CONJUGATE-20 09/23/2021  ? Pension scheme manager 34yr & up 11/06/2021  ? Pneumococcal Conjugate-13 07/01/2014, 09/30/2015, 11/21/2018  ? Pneumococcal Polysaccharide-23 08/30/2012, 09/04/2012  ? Pneumococcal-Unspecified 09/02/2012  ? Tdap 12/02/2010  ? Zoster, Live 11/05/2007  ? ? ?No orders of the defined types were placed in this encounter. ? ?No orders of the defined  types were placed in this encounter. ? ?Return in about 6 months (around 09/01/2022). ? ?I have spent a total time of 20-minutes on the day of the appointment reviewing prior documentation, coordinating care and discussing medical diagnosis and plan with the patient/family. Past medical history, allergies, medications were reviewed. Pertinent imaging, labs and tests included in this note have been reviewed and interpreted independently by me. ? ? ?Charish Schroepfer JRodman Pickle MD ?[facility] ?03/01/2022  1:35 PM ? ? ? ?

## 2022-03-07 ENCOUNTER — Encounter: Payer: Self-pay | Admitting: Pulmonary Disease

## 2022-03-16 DIAGNOSIS — E0841 Diabetes mellitus due to underlying condition with diabetic mononeuropathy: Secondary | ICD-10-CM | POA: Diagnosis not present

## 2022-03-16 DIAGNOSIS — G4700 Insomnia, unspecified: Secondary | ICD-10-CM | POA: Diagnosis not present

## 2022-03-16 DIAGNOSIS — D6869 Other thrombophilia: Secondary | ICD-10-CM | POA: Diagnosis not present

## 2022-03-16 DIAGNOSIS — I10 Essential (primary) hypertension: Secondary | ICD-10-CM | POA: Diagnosis not present

## 2022-03-16 DIAGNOSIS — J90 Pleural effusion, not elsewhere classified: Secondary | ICD-10-CM | POA: Diagnosis not present

## 2022-03-16 DIAGNOSIS — I4891 Unspecified atrial fibrillation: Secondary | ICD-10-CM | POA: Diagnosis not present

## 2022-03-16 DIAGNOSIS — E119 Type 2 diabetes mellitus without complications: Secondary | ICD-10-CM | POA: Diagnosis not present

## 2022-03-16 DIAGNOSIS — R413 Other amnesia: Secondary | ICD-10-CM | POA: Diagnosis not present

## 2022-04-18 DIAGNOSIS — G4700 Insomnia, unspecified: Secondary | ICD-10-CM | POA: Diagnosis not present

## 2022-04-18 DIAGNOSIS — R413 Other amnesia: Secondary | ICD-10-CM | POA: Diagnosis not present

## 2022-06-01 DIAGNOSIS — R413 Other amnesia: Secondary | ICD-10-CM | POA: Diagnosis not present

## 2022-06-01 DIAGNOSIS — G4700 Insomnia, unspecified: Secondary | ICD-10-CM | POA: Diagnosis not present

## 2022-07-18 DIAGNOSIS — M810 Age-related osteoporosis without current pathological fracture: Secondary | ICD-10-CM | POA: Diagnosis not present

## 2022-07-18 DIAGNOSIS — I4891 Unspecified atrial fibrillation: Secondary | ICD-10-CM | POA: Diagnosis not present

## 2022-07-18 DIAGNOSIS — R413 Other amnesia: Secondary | ICD-10-CM | POA: Diagnosis not present

## 2022-07-18 DIAGNOSIS — G4700 Insomnia, unspecified: Secondary | ICD-10-CM | POA: Diagnosis not present

## 2022-07-18 DIAGNOSIS — D6869 Other thrombophilia: Secondary | ICD-10-CM | POA: Diagnosis not present

## 2022-07-18 DIAGNOSIS — J90 Pleural effusion, not elsewhere classified: Secondary | ICD-10-CM | POA: Diagnosis not present

## 2022-07-18 DIAGNOSIS — Z0000 Encounter for general adult medical examination without abnormal findings: Secondary | ICD-10-CM | POA: Diagnosis not present

## 2022-07-18 DIAGNOSIS — I2510 Atherosclerotic heart disease of native coronary artery without angina pectoris: Secondary | ICD-10-CM | POA: Diagnosis not present

## 2022-07-18 DIAGNOSIS — E559 Vitamin D deficiency, unspecified: Secondary | ICD-10-CM | POA: Diagnosis not present

## 2022-07-18 DIAGNOSIS — I10 Essential (primary) hypertension: Secondary | ICD-10-CM | POA: Diagnosis not present

## 2022-07-18 DIAGNOSIS — E119 Type 2 diabetes mellitus without complications: Secondary | ICD-10-CM | POA: Diagnosis not present

## 2022-08-07 ENCOUNTER — Other Ambulatory Visit: Payer: Self-pay | Admitting: Interventional Cardiology

## 2022-08-23 ENCOUNTER — Encounter: Payer: Self-pay | Admitting: Pulmonary Disease

## 2022-08-23 ENCOUNTER — Ambulatory Visit (INDEPENDENT_AMBULATORY_CARE_PROVIDER_SITE_OTHER): Payer: Medicare Other

## 2022-08-23 ENCOUNTER — Ambulatory Visit: Payer: Medicare Other | Admitting: Pulmonary Disease

## 2022-08-23 VITALS — BP 144/60 | HR 71 | Ht 67.0 in | Wt 163.4 lb

## 2022-08-23 DIAGNOSIS — J940 Chylous effusion: Secondary | ICD-10-CM | POA: Diagnosis not present

## 2022-08-23 DIAGNOSIS — J90 Pleural effusion, not elsewhere classified: Secondary | ICD-10-CM | POA: Diagnosis not present

## 2022-08-23 MED ORDER — MONTELUKAST SODIUM 10 MG PO TABS: 10.0000 mg | ORAL_TABLET | Freq: Every day | ORAL | 3 refills | Status: DC

## 2022-08-23 NOTE — Progress Notes (Signed)
.    Subjective:   PATIENT ID: Nicholas Cruz GENDER: male DOB: 1931-02-15, MRN: [id number]   HPI  Chief Complaint  Patient presents with   Follow-up    Doing good no sob    Reason for Visit: Follow-up   Nicholas Cruz is 88 year old male with atrial fibrillation on Eliquis who presents for follow-up for right chylothorax.   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 Unknown etiology despite work-up. Cytology neg multiple times. Routinely requires thoracentesis for symptom relief. 2020 - Thoracentesis in Jan, March, Aug and Dec 2021 - Thoracentesis in July 2022 - Thoracentesis in Feb, July, Dec 2023 - Thoracentesis in Jan  08/23/22 Presents with family who provides additional history. He has been on the tractor all day and has some allergies and cough. Otherwise no shortness of breath, cough or wheezing. Exercises daily including on his bike.   Social History: Former smoker. Quit in 1980. 24 pack-years  Past Medical History:  Diagnosis Date   Atrial fibrillation ([place]) 2008   Colorectal polyps 1985   Duodenal ulcer 1968   H/O knee surgery 1990   History of pulmonary embolism 2007   Hyperlipidemia    Hypertension     Allergies  Allergen Reactions   Pradaxa [Dabigatran Etexilate Mesylate] Other (See Comments)    Hemoptysis   Amiodarone Other (See Comments)    Other reaction(s): intention tremor     Outpatient Medications Prior to Visit  Medication Sig Dispense Refill   cholecalciferol (VITAMIN D3) 25 MCG (1000 UNIT) tablet Take 1,000 Units by mouth daily.     digoxin (LANOXIN) 0.125 MG tablet TAKE 1 TABLET BY MOUTH  DAILY 90 tablet 2   diltiazem (CARDIZEM CD) 360 MG 24 hr capsule TAKE 1 CAPSULE BY MOUTH  DAILY 90 capsule 1   donepezil (ARICEPT) 5 MG tablet Take 5 mg by mouth at bedtime.     ELIQUIS 5 MG TABS tablet TAKE 1 TABLET BY MOUTH  TWICE DAILY 180 tablet 3   famotidine (PEPCID) 20 MG tablet Take 20 mg by mouth at bedtime as needed.      fluticasone (FLONASE) 50 MCG/ACT nasal spray Place 1 spray into both nostrils daily.     furosemide (LASIX) 20 MG tablet Take 20 mg by mouth daily as needed for edema.     GEMTESA 75 MG TABS Take 1 tablet by mouth daily.     irbesartan-hydrochlorothiazide (AVALIDE) 300-12.5 MG tablet TAKE 1 TABLET BY MOUTH DAILY 90 tablet 0   memantine (NAMENDA) 5 MG tablet Take 5 mg by mouth 2 (two) times daily.     Misc Natural Products (PROSTATE HEALTH PO) Take 1 capsule by mouth daily.     montelukast (SINGULAIR) 10 MG tablet TAKE 1 TABLET(10 MG) BY MOUTH AT BEDTIME 30 tablet 2   Multiple Vitamins-Minerals (PRESERVISION AREDS 2+MULTI VIT PO) Take 1 capsule by mouth in the morning and at bedtime.     Omega-3 Fatty Acids (FISH OIL) 1000 MG CAPS Take 1,000 mg by mouth 2 (two) times daily.     pantoprazole (PROTONIX) 40 MG tablet Take 1 tablet by mouth every day (Patient taking differently: Take 40 mg by mouth at bedtime as needed (acid reflux).) 60 tablet 0   potassium chloride SA (K-DUR,KLOR-CON) 20 MEQ tablet Take 1 tablet by mouth every day 60 tablet 0   pravastatin (PRAVACHOL) 80 MG tablet Take 1 tablet by mouth every day (Patient taking differently: Take 80 mg by mouth at bedtime.) 60  tablet 0   tamsulosin (FLOMAX) 0.4 MG CAPS capsule TAKE 1 CAPSULE BY MOUTH  DAILY. 90 capsule 1   traZODone (DESYREL) 50 MG tablet Take 50-100 mg by mouth at bedtime as needed.     Multiple Vitamins-Minerals (MULTIVITAMIN WITH MINERALS) tablet Take 1 tablet by mouth daily.     No facility-administered medications prior to visit.    Review of Systems  Constitutional:  Negative for chills, diaphoresis, fever, malaise/fatigue and weight loss.  HENT:  Negative for congestion.   Respiratory:  Negative for cough, hemoptysis, sputum production, shortness of breath and wheezing.   Cardiovascular:  Negative for chest pain, palpitations and leg swelling.     Objective:   Vitals:   08/23/22 1347  BP: (!) 144/60  Pulse: 71   SpO2: 95%  Weight: 163 lb 6.4 oz (74.1 kg)  Height: '5\' 7"'$  (1.702 m)   SpO2: 95 % O2 Device: None (Room air)  Physical Exam: General: Elderly, well-appearing, no acute distress HENT: [place], AT Eyes: EOMI, no scleral icterus Respiratory: Clear to auscultation bilaterally.  No crackles, wheezing or rales Cardiovascular: RRR, -M/R/G, no JVD Extremities:-Edema,-tenderness Neuro: AAO x4, CNII-XII grossly intact Psych: Normal mood, normal affect  Data Reviewed:  Imaging: CT CAP 07/28/19 - No enlarged mediastinal or hilar lymphadenopathy. Small loculated right pleural effusion. Mild emphysema CXR 07/12/20 - Recurrent small right pleural effusion CXR 07/15/20 - Post-thoracentesis. Similar right pleural effusion. No discernable pneumothorax CXR 01/28/21 - Unchanged small right pleural effusion CXR 02/01/21 - Unchanged small right pleural effusion CXR 12/02/21 - Small right pleural effusion. No PTX CXR 01/03/22 - Mildly increased right pleural effusion compared to prior CXR 01/03/22 - Post thoracentesis with no discernible PTX, decreased right pleural effusion CXR 08/23/22 - Small right pleural effusion   Assessment & Plan:   Discussion: 88 year old male male with hx recurrent chylothorax of unknown etiology who presents for follow-up. Asymptomatic. No indication for thoracentesis today. Discussed management including low-fat diet, exercise and PRN thoracentesis when symptomatic.  Right chylothorax, persistent, unclear etiology --CXR reviewed. Effusion stable --Follow-up with me in 6 month  Nasal congestion --Continue Singulair 10 mg daily --START Flonase in the morning and evening   Health Maintenance Immunization History  Administered Date(s) Administered   Influenza Split 09/08/2009, 09/08/2011, 08/30/2012, 09/06/2018   Influenza Whole 09/17/2013   Influenza, High Dose Seasonal PF 07/26/2016, 09/19/2017, 09/06/2018, 09/02/2019, 09/02/2019   Influenza, Seasonal, Injecte, Preservative  Fre 09/17/2014   Influenza-Unspecified 09/30/2015, 08/18/2021   PFIZER(Purple Top)SARS-COV-2 Vaccination 01/29/2020, 02/19/2020, 10/03/2020   PNEUMOCOCCAL CONJUGATE-20 09/23/2021   Pfizer Covid-19 Vaccine Bivalent Booster 56yr & up 11/06/2021   Pneumococcal Conjugate-13 07/01/2014, 09/30/2015, 11/21/2018   Pneumococcal Polysaccharide-23 08/30/2012, 09/04/2012   Pneumococcal-Unspecified 09/02/2012   Tdap 12/02/2010   Zoster, Live 11/05/2007    Orders Placed This Encounter  Procedures   DG Chest 2 View    Standing Status:   Future    Number of Occurrences:   1    Standing Expiration Date:   08/24/2023    Order Specific Question:   Reason for Exam (SYMPTOM  OR DIAGNOSIS REQUIRED)    Answer:   chylothorax    Order Specific Question:   Preferred imaging location?    Answer:   Internal   Meds ordered this encounter  Medications   montelukast (SINGULAIR) 10 MG tablet    Sig: Take 1 tablet (10 mg total) by mouth at bedtime.    Dispense:  90 tablet    Refill:  3   Return  in about 6 months (around 02/21/2023).  I have spent a total time of 31-minutes on the day of the appointment including chart review, data review, collecting history, coordinating care and discussing medical diagnosis and plan with the patient/family. Past medical history, allergies, medications were reviewed. Pertinent imaging, labs and tests included in this note have been reviewed and interpreted independently by me.  Levis Nazir Rodman Pickle, MD [facility] 08/23/2022  1:47 PM

## 2022-08-23 NOTE — Patient Instructions (Addendum)
Right chylothorax, persistent, unclear etiology --CXR reviewed. Effusion stable  Nasal congestion --Continue Singulair 10 mg daily --START Flonase in the morning and evening   Follow-up with me in 6 month

## 2022-08-29 ENCOUNTER — Encounter: Payer: Self-pay | Admitting: Pulmonary Disease

## 2022-09-05 ENCOUNTER — Telehealth: Payer: Self-pay | Admitting: Pulmonary Disease

## 2022-09-05 NOTE — Telephone Encounter (Signed)
Patient's daughter Nicholas Cruz called and states that patient's niece bought OTC allergy capsules, doterra seasonal blend/ with natural oils. Nicholas Cruz wants Dr. Cordelia Pen recommendation on if patient can take this medication. Nicholas Cruz did say that he was taking montelukast and Flonase as prescribed and that does help.  Please advise, call Nicholas Cruz back at [phone].

## 2022-09-06 NOTE — Telephone Encounter (Signed)
Dr. Loanne Drilling, please advise on this for Nicholas Cruz if you believe the doterra seasonal blend allergy capsules will be okay for pt to take.

## 2022-09-07 NOTE — Telephone Encounter (Signed)
[facility] Telephone Encounter  We discussed allergy pill, doterra seasonal blend/ with natural oils. Unclear if medication is FDA approved for indication of allergies. Unfortunately I cannot endorse use of this medication or comment on effectiveness. Advised to use with caution if patient decided to proceed with taking medication and to monitor for side effects. Daughter and patient expressed understanding and agreed to plan.

## 2022-09-12 IMAGING — DX DG CHEST 2V
2 series · 2 of 2 positions shown · non-contrast
Comparison: Chest radiograph 07/23/2020.  Chest CT 07/29/2020

CLINICAL DATA: Cough and shortness of breath.  Right chylothorax.

EXAM:
CHEST - 2 VIEW

[chest pa]
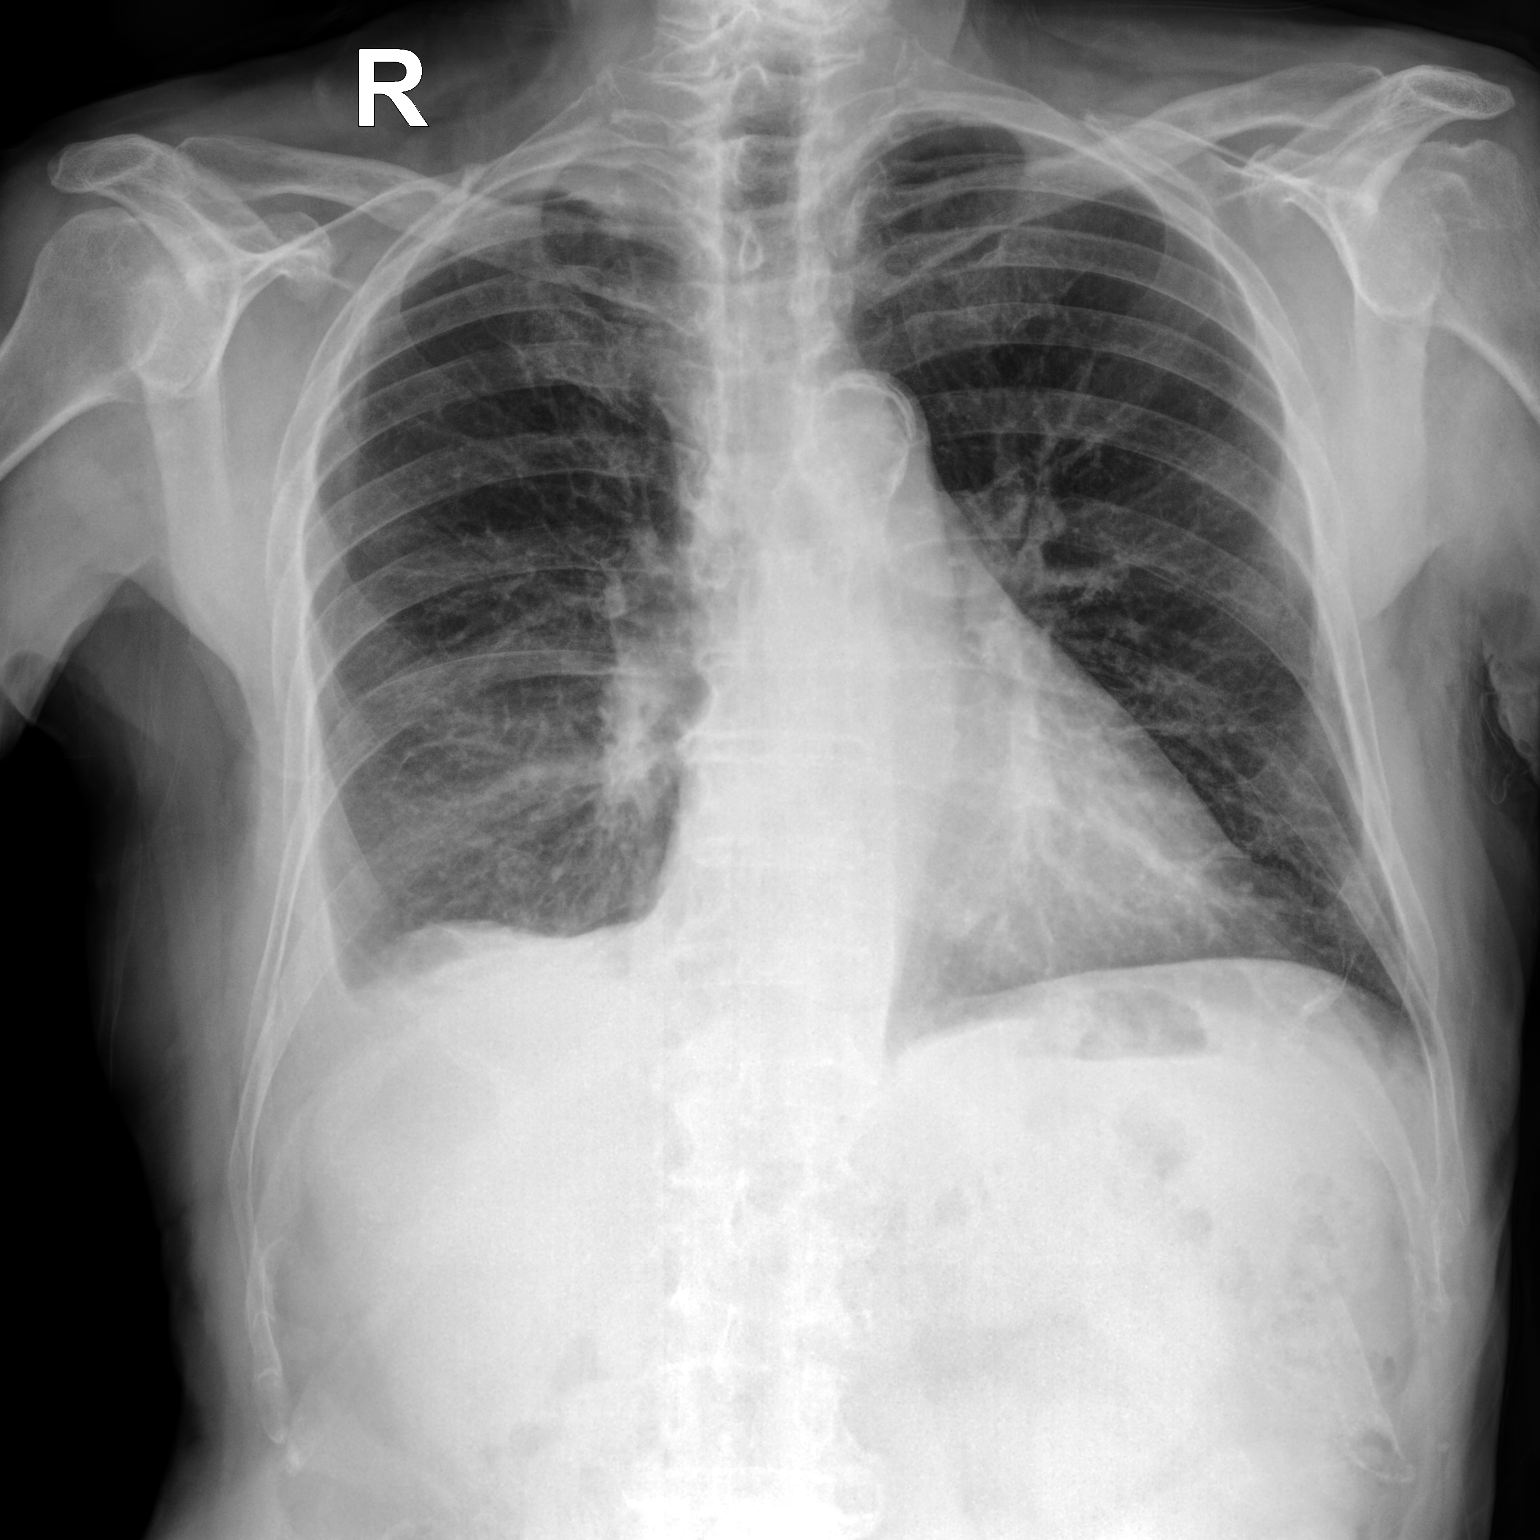

[chest lat]
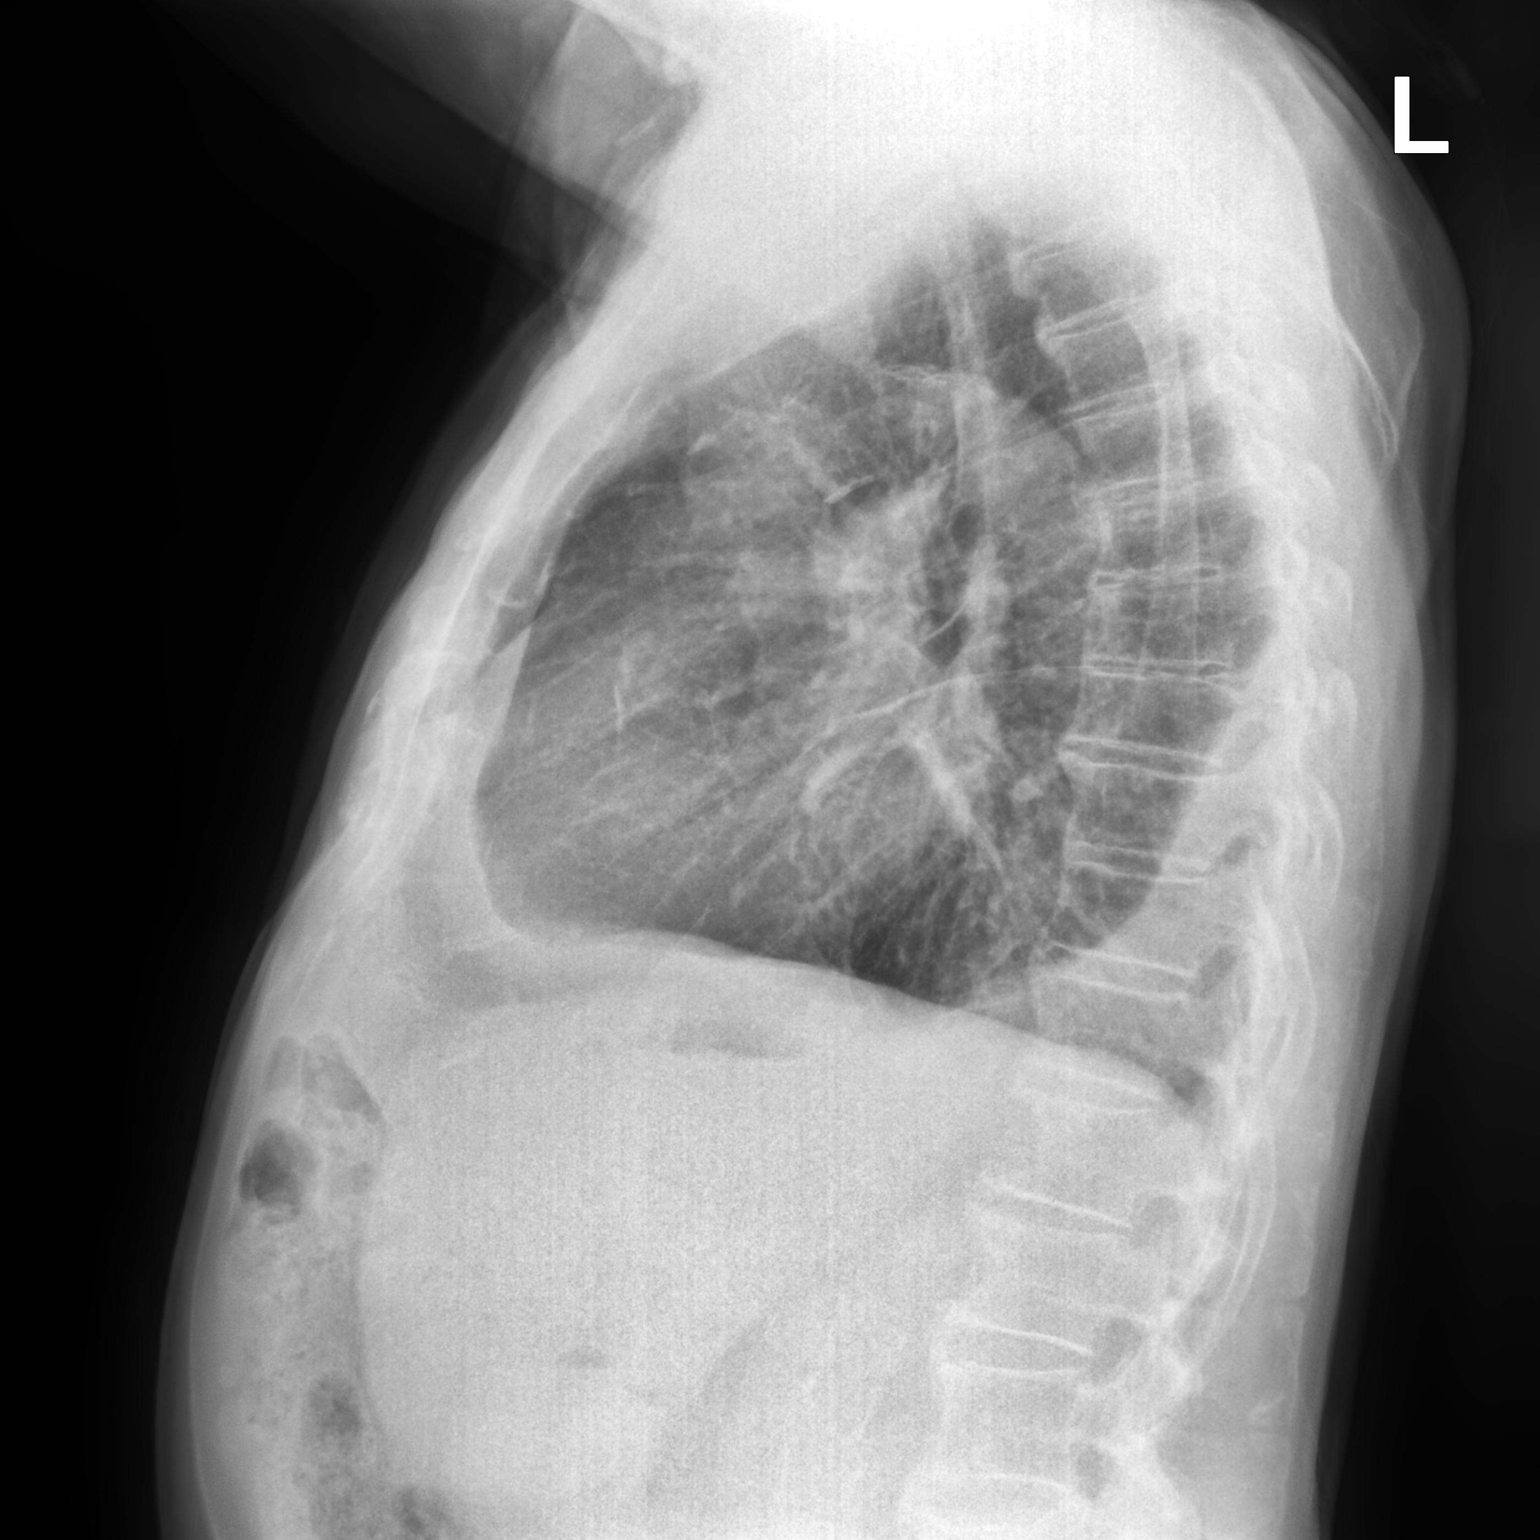

[2 of 2 positions shown; findings below may reference images not displayed]

FINDINGS: Small right pleural effusion is similar to prior exams. No
significant progression. Mild right lung scarring. Stable slight
volume loss in the right hemithorax. Borderline cardiomegaly.
Unchanged mediastinal contours with aortic atherosclerosis. No
pulmonary edema. No left pleural effusion. No evidence of pneumonia
or focal airspace disease. No acute osseous abnormalities are seen.
Thoracic spondylosis, similar.
IMPRESSION: 1. Unchanged small right pleural effusion, corresponding to history
of chylothorax. No significant progression from prior exams.
2. Borderline cardiomegaly. No pulmonary edema.

## 2022-09-16 IMAGING — DX DG CHEST 2V
3 series · 3 of 3 positions shown · non-contrast
Comparison: 01/28/2021, CT 07/29/2020

CLINICAL DATA: Post thoracentesis

EXAM:
CHEST - 2 VIEW

[chest pa (1 of 2)]
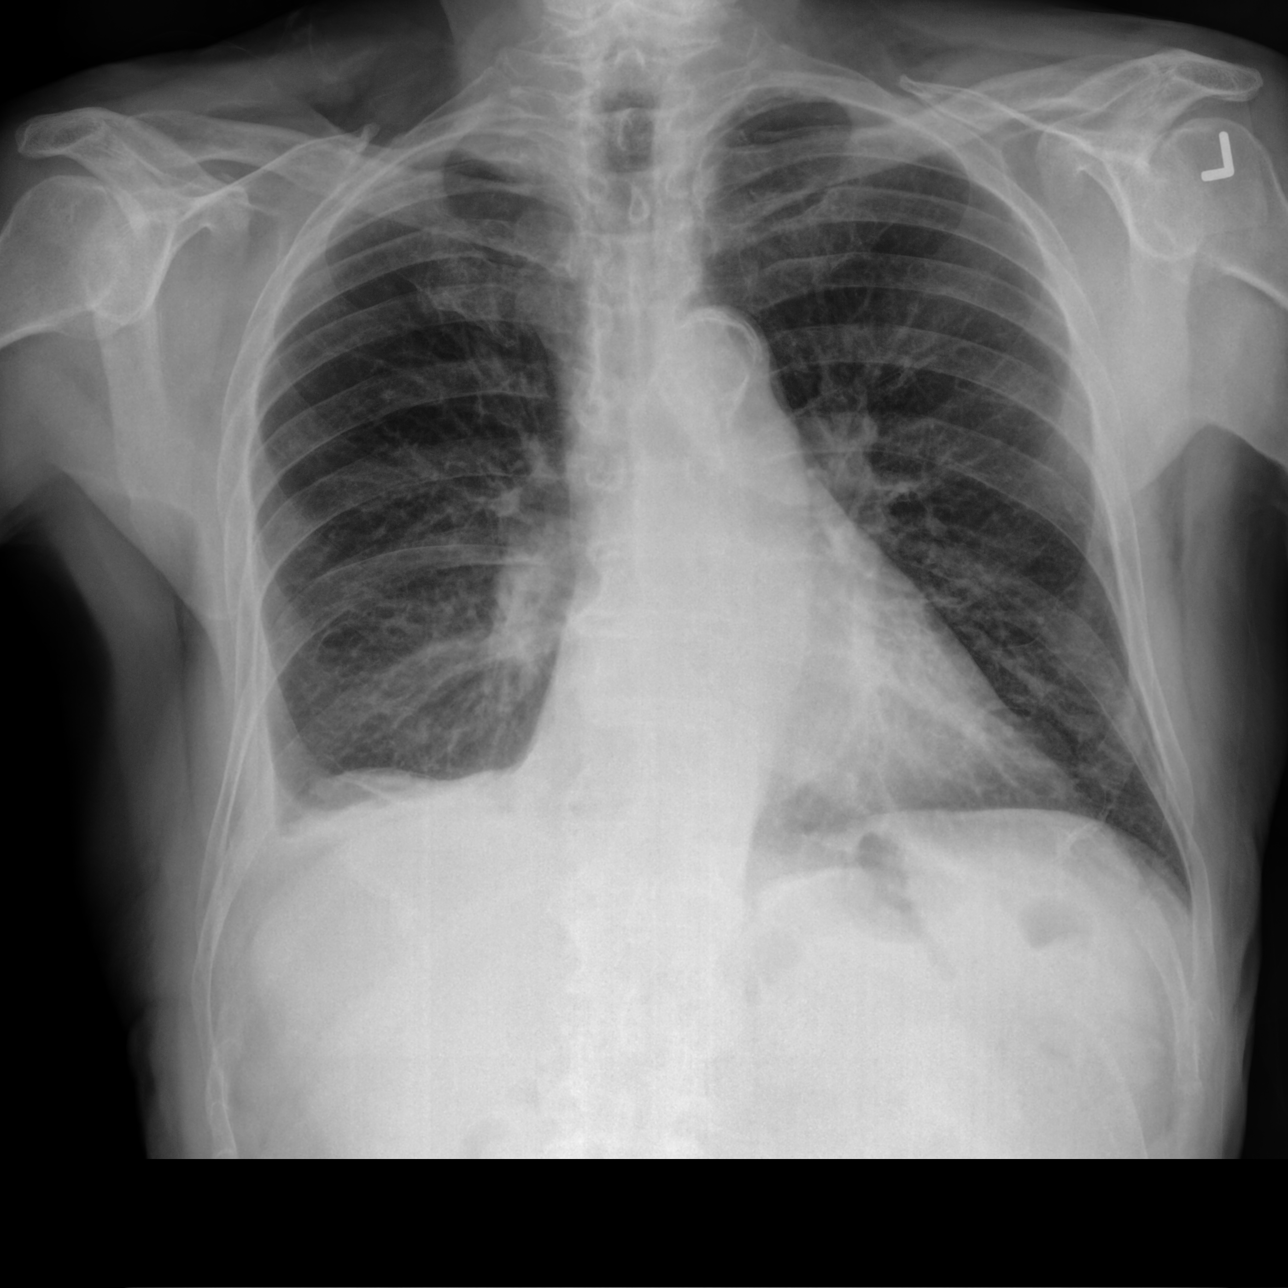

[chest pa (2 of 2)]
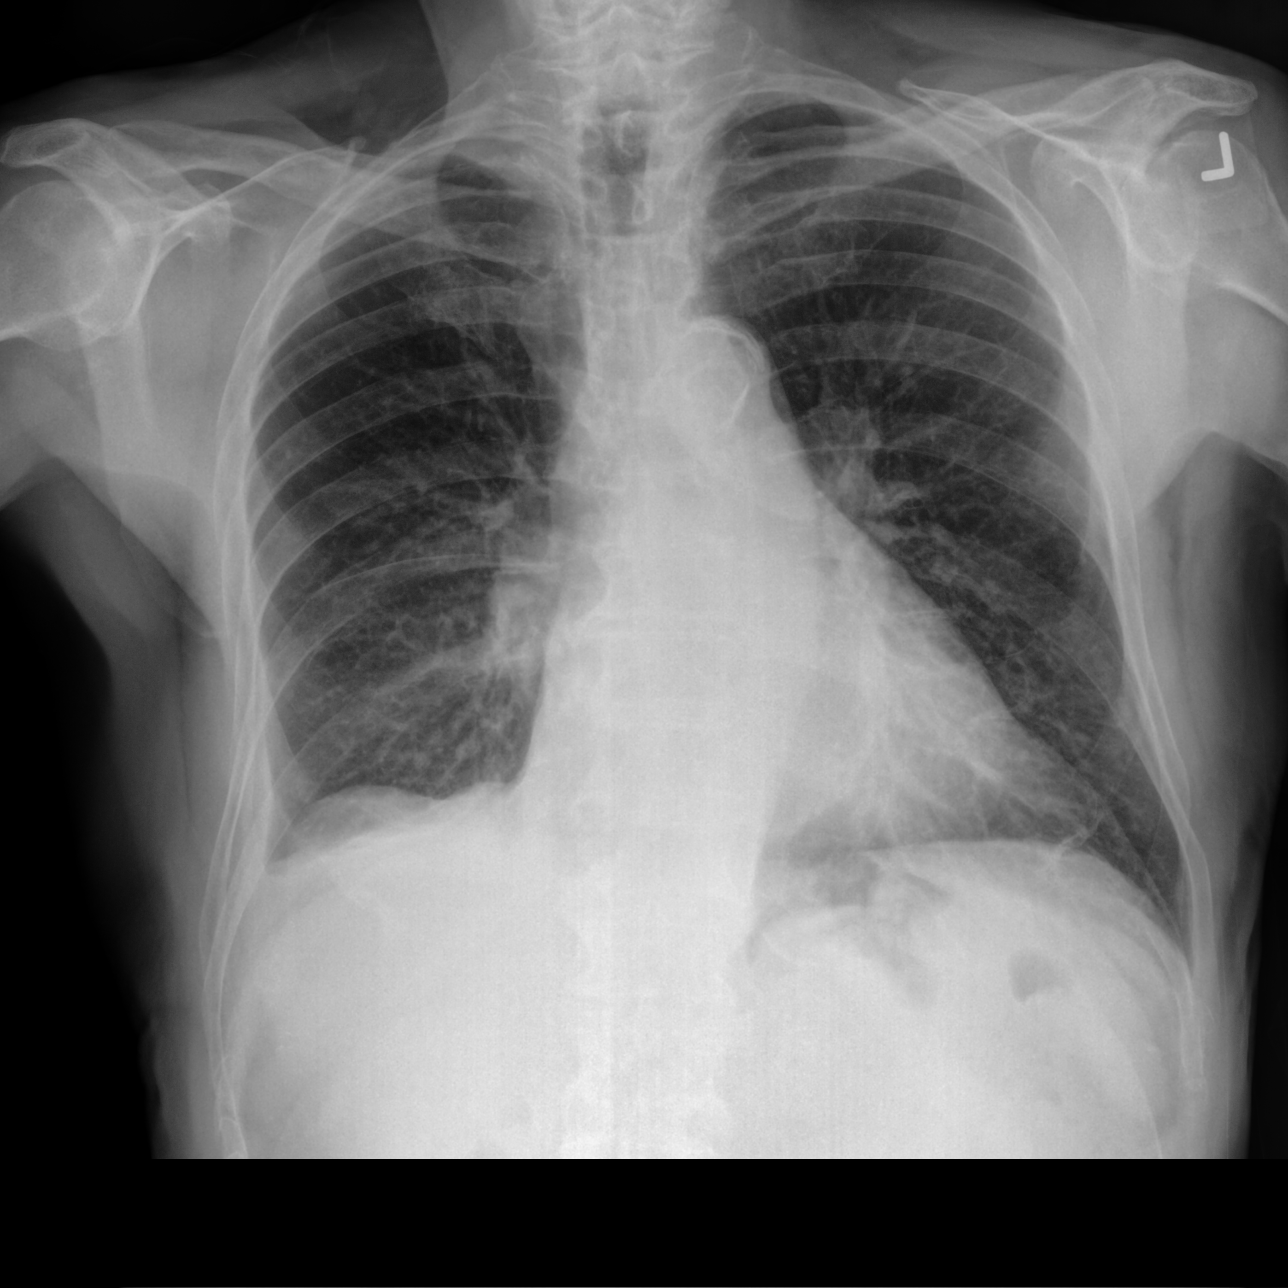

[chest lat]
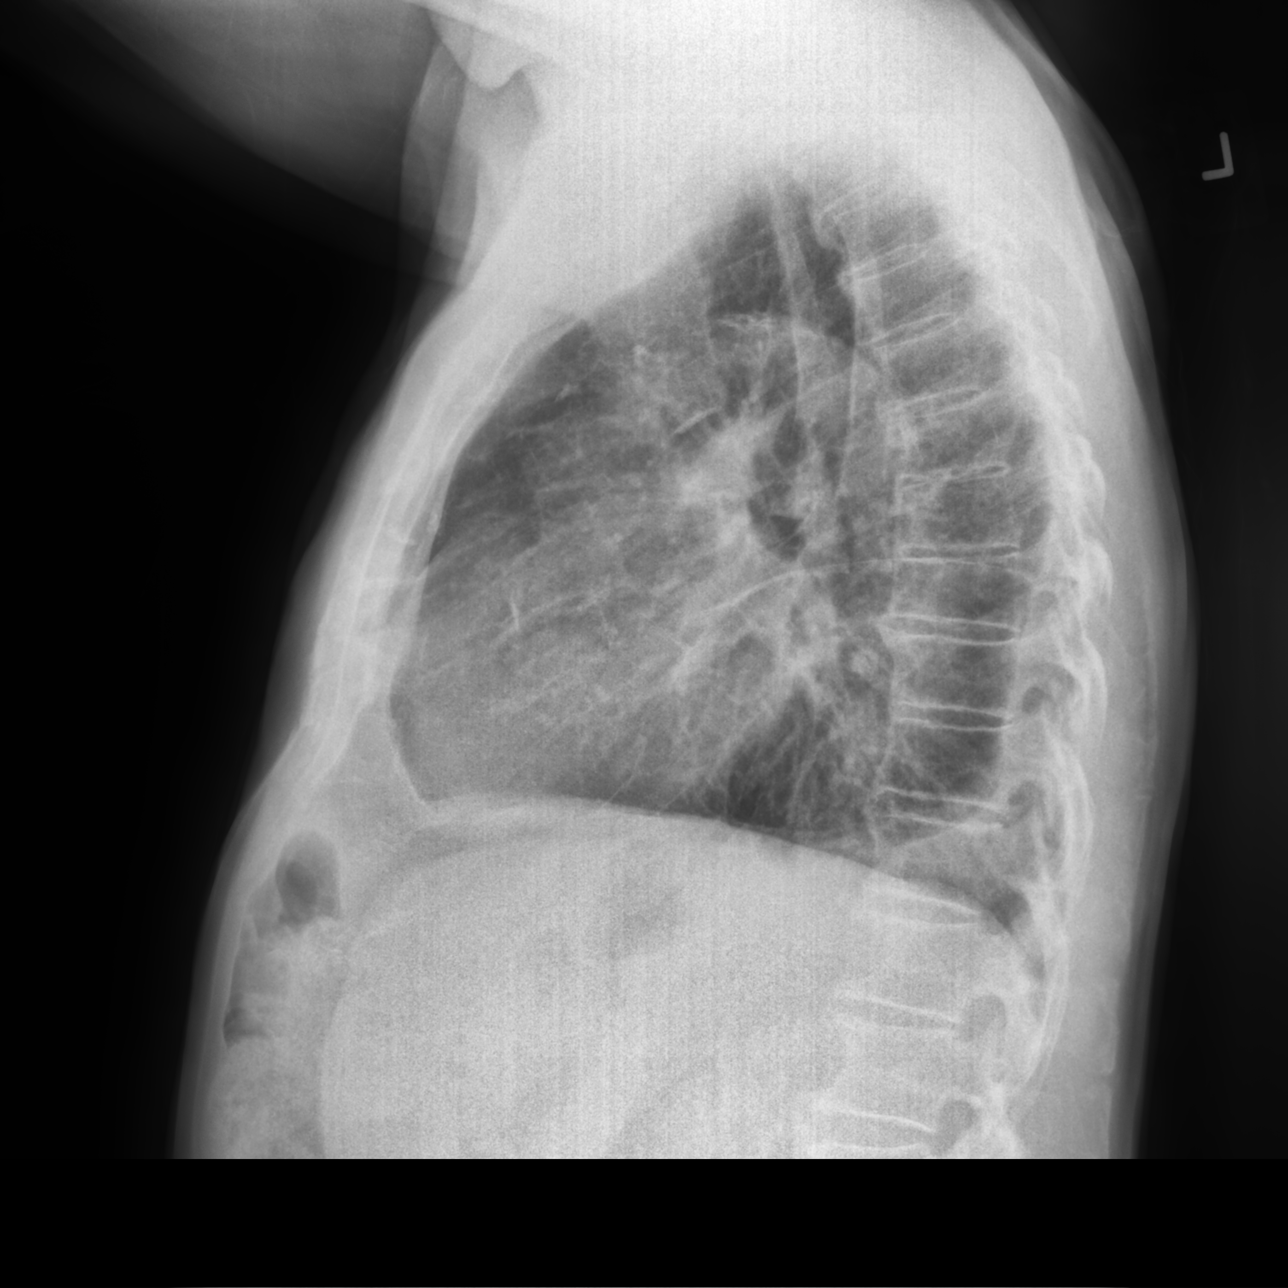

[3 of 3 positions shown; findings below may reference images not displayed]

FINDINGS: Small right pleural effusion without appreciable radiographic
change. No pneumothorax. Stable cardiomediastinal silhouette with
aortic atherosclerosis.
IMPRESSION: Small right pleural effusion without change.  No pneumothorax.

## 2022-10-03 DIAGNOSIS — R509 Fever, unspecified: Secondary | ICD-10-CM | POA: Diagnosis not present

## 2022-10-03 DIAGNOSIS — R0689 Other abnormalities of breathing: Secondary | ICD-10-CM | POA: Diagnosis not present

## 2022-10-03 DIAGNOSIS — R000 Tachycardia, unspecified: Secondary | ICD-10-CM | POA: Diagnosis not present

## 2022-10-03 DIAGNOSIS — R55 Syncope and collapse: Secondary | ICD-10-CM | POA: Diagnosis not present

## 2022-10-03 DIAGNOSIS — I499 Cardiac arrhythmia, unspecified: Secondary | ICD-10-CM | POA: Diagnosis not present

## 2022-10-18 DEATH — deceased

## 2023-01-10 ENCOUNTER — Telehealth: Payer: Self-pay | Admitting: Interventional Cardiology

## 2023-01-10 NOTE — Telephone Encounter (Signed)
Mr. Hashem daughter called to advise [place] that he passed away October 05, 2022.  She wanted to let Dr. Irish Lack and staff know how much she appreciated all the wonderful care he had received from [facility].

## 2023-01-17 ENCOUNTER — Ambulatory Visit: Payer: Medicare Other | Admitting: Interventional Cardiology
